# Patient Record
Sex: Male | Born: 1970 | Race: White | Hispanic: No | Marital: Married | Smoking: Never smoker
Health system: Southern US, Community
[De-identification: ages and names within clinical notes are randomized; demographics above are authoritative.]

## PROBLEM LIST (undated history)

## (undated) DIAGNOSIS — K760 Fatty (change of) liver, not elsewhere classified: Secondary | ICD-10-CM

## (undated) DIAGNOSIS — H9311 Tinnitus, right ear: Secondary | ICD-10-CM

## (undated) DIAGNOSIS — I10 Essential (primary) hypertension: Secondary | ICD-10-CM

## (undated) DIAGNOSIS — J342 Deviated nasal septum: Secondary | ICD-10-CM

## (undated) DIAGNOSIS — L723 Sebaceous cyst: Secondary | ICD-10-CM

## (undated) DIAGNOSIS — S60410A Abrasion of right index finger, initial encounter: Secondary | ICD-10-CM

## (undated) DIAGNOSIS — M66821 Spontaneous rupture of other tendons, right upper arm: Secondary | ICD-10-CM

## (undated) DIAGNOSIS — L03116 Cellulitis of left lower limb: Secondary | ICD-10-CM

## (undated) DIAGNOSIS — M79671 Pain in right foot: Secondary | ICD-10-CM

## (undated) DIAGNOSIS — Z98811 Dental restoration status: Secondary | ICD-10-CM

## (undated) DIAGNOSIS — M25511 Pain in right shoulder: Secondary | ICD-10-CM

## (undated) DIAGNOSIS — E669 Obesity, unspecified: Secondary | ICD-10-CM

## (undated) DIAGNOSIS — Z87442 Personal history of urinary calculi: Secondary | ICD-10-CM

## (undated) DIAGNOSIS — M1712 Unilateral primary osteoarthritis, left knee: Secondary | ICD-10-CM

## (undated) DIAGNOSIS — D682 Hereditary deficiency of other clotting factors: Secondary | ICD-10-CM

## (undated) DIAGNOSIS — M1990 Unspecified osteoarthritis, unspecified site: Secondary | ICD-10-CM

## (undated) DIAGNOSIS — J343 Hypertrophy of nasal turbinates: Secondary | ICD-10-CM

## (undated) DIAGNOSIS — M25562 Pain in left knee: Secondary | ICD-10-CM

## (undated) HISTORY — PX: INGUINAL HERNIA REPAIR: SHX194

## (undated) HISTORY — DX: Fatty (change of) liver, not elsewhere classified: K76.0

## (undated) HISTORY — DX: Tinnitus, right ear: H93.11

## (undated) HISTORY — DX: Essential (primary) hypertension: I10

## (undated) HISTORY — DX: Spontaneous rupture of other tendons, right upper arm: M66.821

## (undated) HISTORY — DX: Pain in left knee: M25.562

## (undated) HISTORY — PX: CYST EXCISION: SHX5701

## (undated) HISTORY — PX: WISDOM TOOTH EXTRACTION: SHX21

## (undated) HISTORY — PX: OTHER SURGICAL HISTORY: SHX169

## (undated) HISTORY — DX: Unilateral primary osteoarthritis, left knee: M17.12

## (undated) HISTORY — DX: Cellulitis of left lower limb: L03.116

## (undated) HISTORY — DX: Sebaceous cyst: L72.3

## (undated) HISTORY — DX: Obesity, unspecified: E66.9

## (undated) HISTORY — DX: Pain in right foot: M79.671

## (undated) HISTORY — DX: Pain in right shoulder: M25.511

---

## 2000-11-29 ENCOUNTER — Ambulatory Visit (HOSPITAL_COMMUNITY): Admission: RE | Admit: 2000-11-29 | Discharge: 2000-11-29 | Payer: Self-pay | Admitting: *Deleted

## 2000-11-29 ENCOUNTER — Encounter (INDEPENDENT_AMBULATORY_CARE_PROVIDER_SITE_OTHER): Payer: Self-pay | Admitting: Specialist

## 2001-09-22 ENCOUNTER — Ambulatory Visit (HOSPITAL_COMMUNITY): Admission: RE | Admit: 2001-09-22 | Discharge: 2001-09-22 | Payer: Self-pay | Admitting: Internal Medicine

## 2001-09-22 ENCOUNTER — Encounter: Payer: Self-pay | Admitting: Internal Medicine

## 2001-11-21 ENCOUNTER — Ambulatory Visit (HOSPITAL_COMMUNITY): Admission: RE | Admit: 2001-11-21 | Discharge: 2001-11-21 | Payer: Self-pay | Admitting: Internal Medicine

## 2001-11-21 ENCOUNTER — Encounter: Payer: Self-pay | Admitting: Internal Medicine

## 2003-03-05 ENCOUNTER — Ambulatory Visit (HOSPITAL_COMMUNITY): Admission: RE | Admit: 2003-03-05 | Discharge: 2003-03-05 | Payer: Self-pay | Admitting: Internal Medicine

## 2003-03-05 ENCOUNTER — Encounter: Payer: Self-pay | Admitting: Internal Medicine

## 2003-03-20 ENCOUNTER — Encounter (HOSPITAL_COMMUNITY): Admission: RE | Admit: 2003-03-20 | Discharge: 2003-04-19 | Payer: Self-pay | Admitting: Neurosurgery

## 2003-12-06 ENCOUNTER — Ambulatory Visit (HOSPITAL_COMMUNITY): Admission: RE | Admit: 2003-12-06 | Discharge: 2003-12-06 | Payer: Self-pay | Admitting: *Deleted

## 2003-12-06 ENCOUNTER — Encounter (INDEPENDENT_AMBULATORY_CARE_PROVIDER_SITE_OTHER): Payer: Self-pay | Admitting: Specialist

## 2005-12-22 ENCOUNTER — Ambulatory Visit (HOSPITAL_COMMUNITY): Admission: RE | Admit: 2005-12-22 | Discharge: 2005-12-22 | Payer: Self-pay | Admitting: Family Medicine

## 2006-03-21 ENCOUNTER — Encounter (HOSPITAL_COMMUNITY): Admission: RE | Admit: 2006-03-21 | Discharge: 2006-04-20 | Payer: Self-pay | Admitting: Family Medicine

## 2006-12-12 ENCOUNTER — Ambulatory Visit: Payer: Self-pay | Admitting: Orthopedic Surgery

## 2007-01-09 ENCOUNTER — Ambulatory Visit: Payer: Self-pay | Admitting: Orthopedic Surgery

## 2012-11-07 DIAGNOSIS — J343 Hypertrophy of nasal turbinates: Secondary | ICD-10-CM

## 2012-11-07 DIAGNOSIS — J342 Deviated nasal septum: Secondary | ICD-10-CM

## 2012-11-07 HISTORY — DX: Hypertrophy of nasal turbinates: J34.3

## 2012-11-07 HISTORY — DX: Deviated nasal septum: J34.2

## 2012-11-20 ENCOUNTER — Encounter (HOSPITAL_BASED_OUTPATIENT_CLINIC_OR_DEPARTMENT_OTHER): Payer: Self-pay | Admitting: *Deleted

## 2012-11-20 DIAGNOSIS — S60410A Abrasion of right index finger, initial encounter: Secondary | ICD-10-CM

## 2012-11-20 HISTORY — DX: Abrasion of right index finger, initial encounter: S60.410A

## 2012-11-20 NOTE — Pre-Procedure Instructions (Signed)
To come for BMET; EKG req. from [facility], [place] [phone])

## 2012-11-21 NOTE — Pre-Procedure Instructions (Signed)
Hx. discussed with Dr. Jean Rosenthal and Dr. Gypsy Balsam; pt. OK to come for surgery.

## 2012-11-23 ENCOUNTER — Encounter (HOSPITAL_BASED_OUTPATIENT_CLINIC_OR_DEPARTMENT_OTHER)
Admission: RE | Admit: 2012-11-23 | Discharge: 2012-11-23 | Disposition: A | Discharge status: Home / Self Care | Payer: 59 | Source: Ambulatory Visit | Attending: Otolaryngology | Admitting: Otolaryngology

## 2012-11-23 LAB — BASIC METABOLIC PANEL
CO2: 29 mEq/L (ref 19–32)
Calcium: 9.8 mg/dL (ref 8.4–10.5)
Creatinine, Ser: 0.97 mg/dL (ref 0.50–1.35)
GFR calc Af Amer: >90 mL/min (ref >90)

## 2012-11-27 ENCOUNTER — Encounter (HOSPITAL_BASED_OUTPATIENT_CLINIC_OR_DEPARTMENT_OTHER): Payer: Self-pay | Admitting: Anesthesiology

## 2012-11-27 ENCOUNTER — Ambulatory Visit (HOSPITAL_BASED_OUTPATIENT_CLINIC_OR_DEPARTMENT_OTHER)
Admission: RE | Admit: 2012-11-27 | Discharge: 2012-11-27 | Disposition: A | Discharge status: Home / Self Care | Payer: 59 | Source: Ambulatory Visit | Attending: Otolaryngology | Admitting: Otolaryngology

## 2012-11-27 ENCOUNTER — Encounter (HOSPITAL_BASED_OUTPATIENT_CLINIC_OR_DEPARTMENT_OTHER)
Admission: RE | Disposition: A | Discharge status: Home / Self Care | Payer: Self-pay | Source: Ambulatory Visit | Attending: Otolaryngology

## 2012-11-27 ENCOUNTER — Ambulatory Visit (HOSPITAL_BASED_OUTPATIENT_CLINIC_OR_DEPARTMENT_OTHER): Payer: 59 | Admitting: Anesthesiology

## 2012-11-27 DIAGNOSIS — J343 Hypertrophy of nasal turbinates: Secondary | ICD-10-CM | POA: Insufficient documentation

## 2012-11-27 DIAGNOSIS — I10 Essential (primary) hypertension: Secondary | ICD-10-CM | POA: Insufficient documentation

## 2012-11-27 DIAGNOSIS — J342 Deviated nasal septum: Secondary | ICD-10-CM | POA: Insufficient documentation

## 2012-11-27 DIAGNOSIS — H6980 Other specified disorders of Eustachian tube, unspecified ear: Secondary | ICD-10-CM | POA: Insufficient documentation

## 2012-11-27 DIAGNOSIS — Z9889 Other specified postprocedural states: Secondary | ICD-10-CM

## 2012-11-27 DIAGNOSIS — Z79899 Other long term (current) drug therapy: Secondary | ICD-10-CM | POA: Insufficient documentation

## 2012-11-27 DIAGNOSIS — H903 Sensorineural hearing loss, bilateral: Secondary | ICD-10-CM | POA: Insufficient documentation

## 2012-11-27 DIAGNOSIS — H6990 Unspecified Eustachian tube disorder, unspecified ear: Secondary | ICD-10-CM | POA: Insufficient documentation

## 2012-11-27 DIAGNOSIS — J3489 Other specified disorders of nose and nasal sinuses: Secondary | ICD-10-CM | POA: Insufficient documentation

## 2012-11-27 DIAGNOSIS — M549 Dorsalgia, unspecified: Secondary | ICD-10-CM | POA: Insufficient documentation

## 2012-11-27 HISTORY — DX: Abrasion of right index finger, initial encounter: S60.410A

## 2012-11-27 HISTORY — DX: Unspecified osteoarthritis, unspecified site: M19.90

## 2012-11-27 HISTORY — DX: Hypertrophy of nasal turbinates: J34.3

## 2012-11-27 HISTORY — DX: Essential (primary) hypertension: I10

## 2012-11-27 HISTORY — DX: Deviated nasal septum: J34.2

## 2012-11-27 HISTORY — PX: NASAL SEPTOPLASTY W/ TURBINOPLASTY: SHX2070

## 2012-11-27 HISTORY — DX: Hereditary deficiency of other clotting factors: D68.2

## 2012-11-27 HISTORY — DX: Dental restoration status: Z98.811

## 2012-11-27 LAB — POCT HEMOGLOBIN-HEMACUE: Hemoglobin: 15.7 g/dL (ref 13.0–17.0)

## 2012-11-27 SURGERY — SEPTOPLASTY, NOSE, WITH NASAL TURBINATE REDUCTION
Anesthesia: General | Site: Nose | Laterality: Bilateral | Wound class: Clean Contaminated

## 2012-11-27 MED ORDER — LIDOCAINE-EPINEPHRINE 1 %-1:100000 IJ SOLN
INTRAMUSCULAR | Status: DC | PRN
  Administered 2012-11-27: 6 mL

## 2012-11-27 MED ORDER — ONDANSETRON HCL 4 MG/2ML IJ SOLN
INTRAMUSCULAR | Status: DC | PRN
  Administered 2012-11-27: 4 mg via INTRAVENOUS

## 2012-11-27 MED ORDER — FENTANYL CITRATE 0.05 MG/ML IJ SOLN
INTRAMUSCULAR | Status: DC | PRN
  Administered 2012-11-27 (×3): 50 ug via INTRAVENOUS
  Administered 2012-11-27: 100 ug via INTRAVENOUS

## 2012-11-27 MED ORDER — HYDROMORPHONE HCL PF 1 MG/ML IJ SOLN: 0.2500 mg | INTRAMUSCULAR | Status: DC | PRN

## 2012-11-27 MED ORDER — OXYMETAZOLINE HCL 0.05 % NA SOLN
NASAL | Status: DC | PRN
  Administered 2012-11-27: 1 via NASAL

## 2012-11-27 MED ORDER — AMOXICILLIN 875 MG PO TABS: 875.0000 mg | ORAL_TABLET | Freq: Two times a day (BID) | ORAL | Status: AC

## 2012-11-27 MED ORDER — MIDAZOLAM HCL 2 MG/ML PO SYRP: 12.0000 mg | ORAL_SOLUTION | Freq: Once | ORAL | Status: DC | PRN

## 2012-11-27 MED ORDER — PROPOFOL 10 MG/ML IV BOLUS
INTRAVENOUS | Status: DC | PRN
  Administered 2012-11-27: 250 mg via INTRAVENOUS

## 2012-11-27 MED ORDER — OXYCODONE HCL 5 MG PO TABS
5.0000 mg | ORAL_TABLET | Freq: Once | ORAL | Status: AC | PRN
  Administered 2012-11-27: 5 mg via ORAL

## 2012-11-27 MED ORDER — METOCLOPRAMIDE HCL 5 MG/ML IJ SOLN: 10.0000 mg | Freq: Once | INTRAMUSCULAR | Status: DC | PRN

## 2012-11-27 MED ORDER — OXYCODONE-ACETAMINOPHEN 5-325 MG PO TABS: 1.0000 | ORAL_TABLET | Freq: Four times a day (QID) | ORAL | Status: DC | PRN

## 2012-11-27 MED ORDER — OXYCODONE HCL 5 MG/5ML PO SOLN: 5.0000 mg | Freq: Once | ORAL | Status: AC | PRN

## 2012-11-27 MED ORDER — LACTATED RINGERS IV SOLN
INTRAVENOUS | Status: DC
  Administered 2012-11-27 (×2): via INTRAVENOUS

## 2012-11-27 MED ORDER — FENTANYL CITRATE 0.05 MG/ML IJ SOLN: 50.0000 ug | INTRAMUSCULAR | Status: DC | PRN

## 2012-11-27 MED ORDER — LIDOCAINE HCL (CARDIAC) 20 MG/ML IV SOLN
INTRAVENOUS | Status: DC | PRN
  Administered 2012-11-27: 100 mg via INTRAVENOUS

## 2012-11-27 MED ORDER — MUPIROCIN 2 % EX OINT
TOPICAL_OINTMENT | CUTANEOUS | Status: DC | PRN
  Administered 2012-11-27: 1 via NASAL

## 2012-11-27 MED ORDER — DEXAMETHASONE SODIUM PHOSPHATE 4 MG/ML IJ SOLN
INTRAMUSCULAR | Status: DC | PRN
  Administered 2012-11-27: 8 mg via INTRAVENOUS

## 2012-11-27 MED ORDER — MIDAZOLAM HCL 2 MG/2ML IJ SOLN: 1.0000 mg | INTRAMUSCULAR | Status: DC | PRN

## 2012-11-27 MED ORDER — SUCCINYLCHOLINE CHLORIDE 20 MG/ML IJ SOLN
INTRAMUSCULAR | Status: DC | PRN
  Administered 2012-11-27: 100 mg via INTRAVENOUS

## 2012-11-27 MED ORDER — MIDAZOLAM HCL 5 MG/5ML IJ SOLN
INTRAMUSCULAR | Status: DC | PRN
  Administered 2012-11-27: 2 mg via INTRAVENOUS

## 2012-11-27 SURGICAL SUPPLY — 33 items
ATTRACTOMAT 16X20 MAGNETIC DRP (DRAPES) IMPLANT
BLADE SURG 15 STRL LF DISP TIS (BLADE) IMPLANT
BLADE SURG 15 STRL SS (BLADE)
CANISTER SUCTION 1200CC (MISCELLANEOUS) ×2 IMPLANT
CLOTH BEACON ORANGE TIMEOUT ST (SAFETY) ×2 IMPLANT
COAGULATOR SUCT 8FR VV (MISCELLANEOUS) ×2 IMPLANT
DECANTER SPIKE VIAL GLASS SM (MISCELLANEOUS) IMPLANT
DRSG NASOPORE 8CM (GAUZE/BANDAGES/DRESSINGS) IMPLANT
DRSG TELFA 3X8 NADH (GAUZE/BANDAGES/DRESSINGS) IMPLANT
ELECT REM PT RETURN 9FT ADLT (ELECTROSURGICAL) ×2
ELECTRODE REM PT RTRN 9FT ADLT (ELECTROSURGICAL) ×1 IMPLANT
GLOVE BIO SURGEON STRL SZ7 (GLOVE) ×2 IMPLANT
GLOVE BIO SURGEON STRL SZ7.5 (GLOVE) ×2 IMPLANT
GOWN PREVENTION PLUS XLARGE (GOWN DISPOSABLE) ×2 IMPLANT
GOWN PREVENTION PLUS XXLARGE (GOWN DISPOSABLE) ×2 IMPLANT
NEEDLE HYPO 25X1 1.5 SAFETY (NEEDLE) ×2 IMPLANT
NS IRRIG 1000ML POUR BTL (IV SOLUTION) ×2 IMPLANT
PACK BASIN DAY SURGERY FS (CUSTOM PROCEDURE TRAY) ×2 IMPLANT
PACK ENT DAY SURGERY (CUSTOM PROCEDURE TRAY) ×2 IMPLANT
SLEEVE SCD COMPRESS KNEE MED (MISCELLANEOUS) IMPLANT
SOLUTION BUTLER CLEAR DIP (MISCELLANEOUS) ×2 IMPLANT
SPLINT NASAL DOYLE BI-VL (GAUZE/BANDAGES/DRESSINGS) ×2 IMPLANT
SPONGE GAUZE 2X2 8PLY STRL LF (GAUZE/BANDAGES/DRESSINGS) ×2 IMPLANT
SPONGE NEURO XRAY DETECT 1X3 (DISPOSABLE) ×2 IMPLANT
SUT CHROMIC 4 0 P 3 18 (SUTURE) ×2 IMPLANT
SUT PLAIN 4 0 ~~LOC~~ 1 (SUTURE) ×2 IMPLANT
SUT PROLENE 3 0 PS 2 (SUTURE) ×2 IMPLANT
SUT VIC AB 4-0 P-3 18XBRD (SUTURE) IMPLANT
SUT VIC AB 4-0 P3 18 (SUTURE)
TOWEL OR 17X24 6PK STRL BLUE (TOWEL DISPOSABLE) ×2 IMPLANT
TUBE SALEM SUMP 12R W/ARV (TUBING) IMPLANT
TUBE SALEM SUMP 16 FR W/ARV (TUBING) ×2 IMPLANT
YANKAUER SUCT BULB TIP NO VENT (SUCTIONS) ×2 IMPLANT

## 2012-11-27 NOTE — Anesthesia Procedure Notes (Signed)
Procedure Name: Intubation Date/Time: 11/27/2012 7:38 AM Performed by: Caren Macadam Pre-anesthesia Checklist: Patient identified, Emergency Drugs available, Suction available and Patient being monitored Patient Re-evaluated:Patient Re-evaluated prior to inductionOxygen Delivery Method: Circle System Utilized Preoxygenation: Pre-oxygenation with 100% oxygen Intubation Type: IV induction Ventilation: Mask ventilation without difficulty Laryngoscope Size: Miller and 2 Grade View: Grade I Tube type: Oral Number of attempts: 1 Airway Equipment and Method: stylet and oral airway Placement Confirmation: ETT inserted through vocal cords under direct vision,  positive ETCO2 and breath sounds checked- equal and bilateral Secured at: 22 cm Tube secured with: Tape Dental Injury: Teeth and Oropharynx as per pre-operative assessment

## 2012-11-27 NOTE — Anesthesia Postprocedure Evaluation (Signed)
Anesthesia Post Note  Patient: Christopher Lawson  Procedure(s) Performed: Procedure(s) (LRB): NASAL SEPTOPLASTY WITH BILATERAL TURBINATE RESECTION (Bilateral)  Anesthesia type: General  Patient location: PACU  Post pain: Pain level controlled  Post assessment: Patient's Cardiovascular Status Stable  Last Vitals:  Filed Vitals:   11/27/12 1015  BP: 143/93  Pulse: 67  Temp:   Resp: 14    Post vital signs: Reviewed and stable  Level of consciousness: alert  Complications: No apparent anesthesia complications

## 2012-11-27 NOTE — Op Note (Signed)
DATE OF PROCEDURE: 11/27/2012  OPERATIVE REPORT   SURGEON: Newman Pies, MD   PREOPERATIVE DIAGNOSES:  1. Severe nasal septal deviation.  2. Bilateral inferior turbinate hypertrophy.  3. Chronic nasal obstruction.  POSTOPERATIVE DIAGNOSES:  1. Severe nasal septal deviation.  2. Bilateral inferior turbinate hypertrophy.  3. Chronic nasal obstruction.  PROCEDURE PERFORMED:  1. Septoplasty.  2. Bilateral partial inferior turbinate resection.   ANESTHESIA: General endotracheal tube anesthesia.   COMPLICATIONS: None.   ESTIMATED BLOOD LOSS: Less than xxx mL.   INDICATION FOR PROCEDURE: Christopher Lawson is a 42 y.o. male with a history of chronic nasal obstruction. The patient was  treated with antihistamine, decongestant, steroid nasal spray, and systemic steroids. However, the patient continues to be symptomatic. On examination, the patient was noted to have bilateral severe inferior turbinate hypertrophy and significant nasal septal deviation, causing significant nasal obstruction. More than 90% of his nasal airway is obstructed bilaterally. Based on the above findings, the decision was made for the patient to undergo the above-stated procedures. The risks, benefits, alternatives, and details of the procedure were discussed with the patient. Questions were invited and answered. Informed consent was obtained.   DESCRIPTION OF PROCEDURE: The patient was taken to the operating room and placed supine on the operating table. General endotracheal tube anesthesia was administered by the anesthesiologist. The patient was positioned, and prepped and draped in the standard fashion for nasal surgery. Pledgets soaked with Afrin were placed in both nasal cavities for decongestion. The pledgets were subsequently removed. The above mentioned severe septal deviation was again noted. 1% lidocaine with 1:100,000 epinephrine was injected onto the nasal septum bilaterally. A hemitransfixion incision was made on the  left side. The mucosal flap was carefully elevated on the left side. A cartilaginous incision was made 1 cm superior to the caudal margin of the nasal septum. Mucosal flap was also elevated on the right side in the similar fashion. It should be noted that due to the severe septal deviation, the deviated portion of the cartilaginous and bony septum had to be removed in piecemeal fashion. Once the deviated portions were removed, a straight midline septum was achieved. The septum was then quilted with 4-0 plain gut sutures. The hemitransfixion incision was closed with interrupted 4-0 chromic sutures. Doyle splints were applied.   Prior to the [facility] splint application, the inferior one half of both hypertrophied inferior turbinate was crossclamped with a Kelly clamp. The inferior one half of each inferior turbinate was then resected with a pair of cross cutting scissors. Hemostasis was achieved with a suction cautery device.   The care of the patient was turned over to the anesthesiologist. The patient was awakened from anesthesia without difficulty. The patient was extubated and transferred to the recovery room in good condition.   OPERATIVE FINDINGS: Severe nasal septal deviation and bilateral inferior turbinate hypertrophy.   SPECIMEN: None.   FOLLOWUP CARE: The patient be discharged home once he is awake and alert. The patient will be placed on Percocet 1-2 tablets p.o. q.6 hours p.r.n. pain, and amoxicillin 875 mg p.o. b.i.d. for 5 days. The patient will follow up in my office in approximately 1 week for splint removal.   Yuriana Gaal Philomena Doheny, MD

## 2012-11-27 NOTE — Anesthesia Preprocedure Evaluation (Signed)
Anesthesia Evaluation  Patient identified by MRN, date of birth, ID band Patient awake    Reviewed: Allergy & Precautions, H&P , NPO status , Patient's Chart, lab work & pertinent test results, reviewed documented beta blocker date and time   Airway Mallampati: II TM Distance: >3 FB Neck ROM: full    Dental   Pulmonary neg pulmonary ROS,  breath sounds clear to auscultation        Cardiovascular hypertension, On Medications Rhythm:regular     Neuro/Psych negative neurological ROS  negative psych ROS   GI/Hepatic negative GI ROS, Neg liver ROS,   Endo/Other  negative endocrine ROS  Renal/GU negative Renal ROS  negative genitourinary   Musculoskeletal   Abdominal   Peds  Hematology  (+) Blood dyscrasia, ,   Anesthesia Other Findings See surgeon's H&P   Reproductive/Obstetrics negative OB ROS                           Anesthesia Physical Anesthesia Plan  ASA: II  Anesthesia Plan: General   Post-op Pain Management:    Induction: Intravenous  Airway Management Planned: Oral ETT  Additional Equipment:   Intra-op Plan:   Post-operative Plan: Extubation in OR  Informed Consent: I have reviewed the patients History and Physical, chart, labs and discussed the procedure including the risks, benefits and alternatives for the proposed anesthesia with the patient or authorized representative who has indicated his/her understanding and acceptance.   Dental Advisory Given  Plan Discussed with: CRNA and Surgeon  Anesthesia Plan Comments:         Anesthesia Quick Evaluation

## 2012-11-27 NOTE — Transfer of Care (Signed)
Immediate Anesthesia Transfer of Care Note  Patient: Christopher Lawson  Procedure(s) Performed: Procedure(s): NASAL SEPTOPLASTY WITH BILATERAL TURBINATE RESECTION (Bilateral)  Patient Location: PACU  Anesthesia Type:General  Level of Consciousness: awake  Airway & Oxygen Therapy: Patient Spontanous Breathing and Patient connected to face mask oxygen  Post-op Assessment: Report given to PACU RN and Post -op Vital signs reviewed and stable  Post vital signs: Reviewed and stable  Complications: No apparent anesthesia complications

## 2012-11-27 NOTE — H&P (Signed)
  H&P Update  Pt's original H&P dated 10/30/12 reviewed and placed in chart (to be scanned).  I personally examined the patient today.  No change in health. Proceed with septoplasty and bilateral turbinate reduction.

## 2012-11-27 NOTE — Brief Op Note (Signed)
11/27/2012  9:12 AM  PATIENT:  Christopher Lawson  42 y.o. male  PRE-OPERATIVE DIAGNOSIS:  DEVIATED SEPTUM AND BILATERAL TURBINATE HYPERTROPHY  POST-OPERATIVE DIAGNOSIS:  DEVIATED SEPTUM AND BILATERAL TURBINATE HYPERTROPHY  PROCEDURE:  Procedure(s): 1) NASAL SEPTOPLASTY  2) BILATERAL PARTIAL INFERIOR TURBINATE RESECTION  SURGEON:  Surgeon(s) and Role:    * Darletta Moll, MD - Primary  PHYSICIAN ASSISTANT:   ASSISTANTS: none   ANESTHESIA:   general  EBL:  Total I/O In: 1200 [I.V.:1200] Out: -  BLOOD ADMINISTERED:none  DRAINS: none   LOCAL MEDICATIONS USED:  LIDOCAINE   SPECIMEN:  No Specimen  DISPOSITION OF SPECIMEN:  N/A  COUNTS:  YES  TOURNIQUET:  * No tourniquets in log *  DICTATION: .Note written in EPIC  PLAN OF CARE: Discharge to home after PACU  PATIENT DISPOSITION:  PACU - hemodynamically stable.   Delay start of Pharmacological VTE agent (>24hrs) due to surgical blood loss or risk of bleeding: not applicable

## 2012-11-28 ENCOUNTER — Encounter (HOSPITAL_BASED_OUTPATIENT_CLINIC_OR_DEPARTMENT_OTHER): Payer: Self-pay | Admitting: Otolaryngology

## 2013-01-30 ENCOUNTER — Ambulatory Visit (HOSPITAL_COMMUNITY)
Admission: RE | Admit: 2013-01-30 | Discharge: 2013-01-30 | Disposition: A | Discharge status: Home / Self Care | Payer: 59 | Source: Ambulatory Visit | Attending: Internal Medicine | Admitting: Internal Medicine

## 2013-01-30 ENCOUNTER — Other Ambulatory Visit (HOSPITAL_COMMUNITY): Payer: Self-pay | Admitting: Internal Medicine

## 2013-01-30 DIAGNOSIS — R05 Cough: Secondary | ICD-10-CM

## 2013-01-30 DIAGNOSIS — R059 Cough, unspecified: Secondary | ICD-10-CM

## 2014-04-29 ENCOUNTER — Ambulatory Visit (INDEPENDENT_AMBULATORY_CARE_PROVIDER_SITE_OTHER): Payer: 59 | Admitting: Orthopedic Surgery

## 2014-04-29 DIAGNOSIS — M25521 Pain in right elbow: Secondary | ICD-10-CM

## 2014-04-29 DIAGNOSIS — M25529 Pain in unspecified elbow: Secondary | ICD-10-CM

## 2014-04-30 DIAGNOSIS — M25529 Pain in unspecified elbow: Secondary | ICD-10-CM | POA: Insufficient documentation
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
## 2014-08-19 ENCOUNTER — Ambulatory Visit (INDEPENDENT_AMBULATORY_CARE_PROVIDER_SITE_OTHER): Payer: 59 | Admitting: Orthopedic Surgery

## 2014-08-19 ENCOUNTER — Encounter: Payer: Self-pay | Admitting: Orthopedic Surgery

## 2014-08-19 VITALS — BP 132/98 | Ht 72.0 in | Wt 229.0 lb

## 2014-08-19 DIAGNOSIS — S46111A Strain of muscle, fascia and tendon of long head of biceps, right arm, initial encounter: Secondary | ICD-10-CM

## 2014-08-19 DIAGNOSIS — S46219A Strain of muscle, fascia and tendon of other parts of biceps, unspecified arm, initial encounter: Secondary | ICD-10-CM | POA: Insufficient documentation

## 2014-08-19 MED ORDER — NAPROXEN 500 MG PO TABS: 500.0000 mg | ORAL_TABLET | Freq: Two times a day (BID) | ORAL | Status: DC

## 2014-08-19 NOTE — Patient Instructions (Signed)
We will schedule MRI for you and call you with results 
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
## 2014-08-23 ENCOUNTER — Encounter: Payer: Self-pay | Admitting: Orthopedic Surgery

## 2014-08-23 ENCOUNTER — Encounter (HOSPITAL_COMMUNITY): Payer: Self-pay

## 2014-08-23 ENCOUNTER — Other Ambulatory Visit (HOSPITAL_COMMUNITY): Payer: 59

## 2014-08-23 ENCOUNTER — Ambulatory Visit (HOSPITAL_COMMUNITY)
Admission: RE | Admit: 2014-08-23 | Discharge: 2014-08-23 | Disposition: A | Discharge status: Home / Self Care | Payer: 59 | Source: Ambulatory Visit | Attending: Orthopedic Surgery | Admitting: Orthopedic Surgery

## 2014-08-23 DIAGNOSIS — M25521 Pain in right elbow: Secondary | ICD-10-CM | POA: Diagnosis not present

## 2014-08-23 DIAGNOSIS — S46111A Strain of muscle, fascia and tendon of long head of biceps, right arm, initial encounter: Secondary | ICD-10-CM

## 2014-08-28 ENCOUNTER — Inpatient Hospital Stay (HOSPITAL_COMMUNITY): Admission: RE | Admit: 2014-08-28 | Payer: 59 | Source: Ambulatory Visit

## 2014-08-29 ENCOUNTER — Telehealth: Payer: Self-pay | Admitting: Orthopedic Surgery

## 2014-08-29 ENCOUNTER — Encounter: Payer: Self-pay | Admitting: Orthopedic Surgery

## 2014-08-29 ENCOUNTER — Ambulatory Visit (INDEPENDENT_AMBULATORY_CARE_PROVIDER_SITE_OTHER): Payer: 59 | Admitting: Orthopedic Surgery

## 2014-08-29 VITALS — BP 150/91 | Ht 72.0 in | Wt 230.0 lb

## 2014-08-29 DIAGNOSIS — M7521 Bicipital tendinitis, right shoulder: Secondary | ICD-10-CM

## 2014-08-29 DIAGNOSIS — S46111D Strain of muscle, fascia and tendon of long head of biceps, right arm, subsequent encounter: Secondary | ICD-10-CM

## 2014-08-29 MED ORDER — INDOMETHACIN 50 MG PO CAPS: 50.0000 mg | ORAL_CAPSULE | Freq: Two times a day (BID) | ORAL | Status: DC
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
## 2014-08-29 NOTE — Patient Instructions (Signed)
REST   OOW 4 WEEKS   START NEW MEDICATION

## 2014-08-29 NOTE — Telephone Encounter (Signed)
Notes/form faxed to [employer] [facility] regarding patient's short-term disability claim, to Fax# [phone]/Ph# [phone]. Patient aware.

## 2014-09-26 ENCOUNTER — Encounter: Payer: Self-pay | Admitting: Orthopedic Surgery

## 2014-09-26 ENCOUNTER — Ambulatory Visit (INDEPENDENT_AMBULATORY_CARE_PROVIDER_SITE_OTHER): Payer: 59 | Admitting: Orthopedic Surgery

## 2014-09-26 ENCOUNTER — Telehealth: Payer: Self-pay | Admitting: Orthopedic Surgery

## 2014-09-26 VITALS — BP 140/97 | Ht 72.0 in | Wt 230.0 lb

## 2014-09-26 DIAGNOSIS — M7521 Bicipital tendinitis, right shoulder: Secondary | ICD-10-CM

## 2014-09-26 NOTE — Patient Instructions (Addendum)
Will refer to Dr Butler DenmarkGrammig  Out of work 4 weeks
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
## 2014-09-26 NOTE — Telephone Encounter (Signed)
    Expand All Collapse All   Notes/form faxed to [facility] regarding patient's short-term disability claim, to Fax# [phone]/Ph# [phone]. Patient aware.

## 2014-09-30 ENCOUNTER — Telehealth: Payer: Self-pay | Admitting: *Deleted

## 2014-09-30 ENCOUNTER — Other Ambulatory Visit: Payer: Self-pay | Admitting: *Deleted

## 2014-09-30 DIAGNOSIS — M7521 Bicipital tendinitis, right shoulder: Secondary | ICD-10-CM

## 2014-09-30 NOTE — Telephone Encounter (Signed)
REFERRAL FAXED TO DR Amanda PeaGRAMIG 09/30/14

## 2014-10-04 NOTE — Telephone Encounter (Signed)
Has appointment 10/11/14 10:15am w/ Dr Amanda PeaGramig

## 2016-08-11 DIAGNOSIS — M67921 Unspecified disorder of synovium and tendon, right upper arm: Secondary | ICD-10-CM | POA: Diagnosis not present

## 2016-08-18 DIAGNOSIS — M67921 Unspecified disorder of synovium and tendon, right upper arm: Secondary | ICD-10-CM | POA: Diagnosis not present

## 2016-08-19 DIAGNOSIS — L602 Onychogryphosis: Secondary | ICD-10-CM | POA: Diagnosis not present

## 2016-08-19 DIAGNOSIS — L03031 Cellulitis of right toe: Secondary | ICD-10-CM | POA: Diagnosis not present

## 2016-08-20 DIAGNOSIS — M67921 Unspecified disorder of synovium and tendon, right upper arm: Secondary | ICD-10-CM | POA: Diagnosis not present

## 2016-08-20 DIAGNOSIS — Z4789 Encounter for other orthopedic aftercare: Secondary | ICD-10-CM | POA: Diagnosis not present

## 2016-08-23 DIAGNOSIS — M67921 Unspecified disorder of synovium and tendon, right upper arm: Secondary | ICD-10-CM | POA: Diagnosis not present

## 2016-09-01 DIAGNOSIS — M67921 Unspecified disorder of synovium and tendon, right upper arm: Secondary | ICD-10-CM | POA: Diagnosis not present

## 2016-09-10 DIAGNOSIS — Z4789 Encounter for other orthopedic aftercare: Secondary | ICD-10-CM | POA: Diagnosis not present

## 2016-09-10 DIAGNOSIS — M67921 Unspecified disorder of synovium and tendon, right upper arm: Secondary | ICD-10-CM | POA: Diagnosis not present

## 2016-09-17 DIAGNOSIS — M67921 Unspecified disorder of synovium and tendon, right upper arm: Secondary | ICD-10-CM | POA: Diagnosis not present

## 2016-09-20 DIAGNOSIS — M67921 Unspecified disorder of synovium and tendon, right upper arm: Secondary | ICD-10-CM | POA: Diagnosis not present

## 2016-09-27 DIAGNOSIS — M67921 Unspecified disorder of synovium and tendon, right upper arm: Secondary | ICD-10-CM | POA: Diagnosis not present

## 2016-10-04 DIAGNOSIS — M67921 Unspecified disorder of synovium and tendon, right upper arm: Secondary | ICD-10-CM | POA: Diagnosis not present

## 2016-10-08 DIAGNOSIS — M67921 Unspecified disorder of synovium and tendon, right upper arm: Secondary | ICD-10-CM | POA: Diagnosis not present

## 2016-10-12 DIAGNOSIS — M67921 Unspecified disorder of synovium and tendon, right upper arm: Secondary | ICD-10-CM | POA: Diagnosis not present

## 2016-10-19 DIAGNOSIS — M67921 Unspecified disorder of synovium and tendon, right upper arm: Secondary | ICD-10-CM | POA: Diagnosis not present

## 2016-11-01 DIAGNOSIS — M67921 Unspecified disorder of synovium and tendon, right upper arm: Secondary | ICD-10-CM | POA: Diagnosis not present

## 2016-12-08 DIAGNOSIS — M67921 Unspecified disorder of synovium and tendon, right upper arm: Secondary | ICD-10-CM | POA: Diagnosis not present

## 2016-12-08 DIAGNOSIS — Z4789 Encounter for other orthopedic aftercare: Secondary | ICD-10-CM | POA: Diagnosis not present

## 2017-02-24 DIAGNOSIS — L602 Onychogryphosis: Secondary | ICD-10-CM | POA: Diagnosis not present

## 2017-02-24 DIAGNOSIS — L03031 Cellulitis of right toe: Secondary | ICD-10-CM | POA: Diagnosis not present

## 2017-03-03 DIAGNOSIS — I10 Essential (primary) hypertension: Secondary | ICD-10-CM | POA: Diagnosis not present

## 2017-03-03 DIAGNOSIS — M542 Cervicalgia: Secondary | ICD-10-CM | POA: Diagnosis not present

## 2017-08-24 DIAGNOSIS — L602 Onychogryphosis: Secondary | ICD-10-CM | POA: Diagnosis not present

## 2017-08-24 DIAGNOSIS — L03031 Cellulitis of right toe: Secondary | ICD-10-CM | POA: Diagnosis not present

## 2017-10-05 ENCOUNTER — Ambulatory Visit (HOSPITAL_COMMUNITY)
Admission: RE | Admit: 2017-10-05 | Discharge: 2017-10-05 | Disposition: A | Discharge status: Home / Self Care | Payer: 59 | Source: Ambulatory Visit | Attending: Pulmonary Disease | Admitting: Pulmonary Disease

## 2017-10-05 ENCOUNTER — Other Ambulatory Visit (HOSPITAL_COMMUNITY): Payer: Self-pay | Admitting: Pulmonary Disease

## 2017-10-05 DIAGNOSIS — M25562 Pain in left knee: Secondary | ICD-10-CM

## 2017-10-05 DIAGNOSIS — M1712 Unilateral primary osteoarthritis, left knee: Secondary | ICD-10-CM | POA: Insufficient documentation

## 2017-10-05 DIAGNOSIS — I10 Essential (primary) hypertension: Secondary | ICD-10-CM | POA: Diagnosis not present

## 2017-12-07 ENCOUNTER — Other Ambulatory Visit: Payer: Self-pay

## 2017-12-07 ENCOUNTER — Emergency Department (HOSPITAL_COMMUNITY)
Admission: EM | Admit: 2017-12-07 | Discharge: 2017-12-07 | Disposition: A | Discharge status: Home / Self Care | Payer: 59 | Attending: Emergency Medicine | Admitting: Emergency Medicine

## 2017-12-07 ENCOUNTER — Encounter (HOSPITAL_COMMUNITY): Payer: Self-pay | Admitting: Emergency Medicine

## 2017-12-07 DIAGNOSIS — Y9389 Activity, other specified: Secondary | ICD-10-CM | POA: Diagnosis not present

## 2017-12-07 DIAGNOSIS — Y929 Unspecified place or not applicable: Secondary | ICD-10-CM | POA: Insufficient documentation

## 2017-12-07 DIAGNOSIS — I10 Essential (primary) hypertension: Secondary | ICD-10-CM | POA: Diagnosis not present

## 2017-12-07 DIAGNOSIS — Y999 Unspecified external cause status: Secondary | ICD-10-CM | POA: Diagnosis not present

## 2017-12-07 DIAGNOSIS — Z79899 Other long term (current) drug therapy: Secondary | ICD-10-CM | POA: Diagnosis not present

## 2017-12-07 DIAGNOSIS — S61215A Laceration without foreign body of left ring finger without damage to nail, initial encounter: Secondary | ICD-10-CM

## 2017-12-07 DIAGNOSIS — W298XXA Contact with other powered powered hand tools and household machinery, initial encounter: Secondary | ICD-10-CM | POA: Diagnosis not present

## 2017-12-07 MED ORDER — TETANUS-DIPHTH-ACELL PERTUSSIS 5-2.5-18.5 LF-MCG/0.5 IM SUSP
0.5000 mL | Freq: Once | INTRAMUSCULAR | Status: AC
  Administered 2017-12-07: 0.5 mL via INTRAMUSCULAR
  Filled 2017-12-07: qty 0.5

## 2017-12-07 MED ORDER — CEPHALEXIN 500 MG PO CAPS: 500.0000 mg | ORAL_CAPSULE | Freq: Four times a day (QID) | ORAL | 0 refills | Status: DC

## 2017-12-07 MED ORDER — CEPHALEXIN 500 MG PO CAPS
500.0000 mg | ORAL_CAPSULE | Freq: Once | ORAL | Status: AC
  Administered 2017-12-07: 500 mg via ORAL
  Filled 2017-12-07: qty 1

## 2017-12-07 NOTE — ED Triage Notes (Signed)
Pt pressure washing and water hit left ring finger. Mild laceration noted to anterior left ring finger with bleeding controlled. uknknown TET. Nad.

## 2017-12-07 NOTE — ED Provider Notes (Signed)
[facility] EMERGENCY DEPARTMENT Provider Note   CSN: [id number] Arrival date & time: 12/07/17  1426     History   Chief Complaint Chief Complaint  Patient presents with  . Laceration    HPI Christopher Lawson is a 47 y.o. male.  HPI   Christopher Lawson is a 47 y.o. male who presents to the Emergency Department complaining of laceration to the left ring finger.  He states that he was using a pressure washer when the nozzle slipped in his hand causing the laceration secondary to the  pressure from the water.  He is concerned about infection stating that his hand was dirty.  Last tetanus is unknown.  He denies pain, numbness or swelling or the finger.      Past Medical History:  Diagnosis Date  . Abrasion of right index finger 11/20/2012  . Arthritis    left shoulder - to have surgery 12/12/2012  . Dental crown present   . Deviated nasal septum 11/2012  . 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 Hypertension    under control with meds., has been on med. x 5 yr.  . 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    Patient Active Problem List   Diagnosis Date Noted  . Biceps tendon tear 08/19/2014  . Elbow pain 04/30/2014    Past Surgical History:  Procedure Laterality Date  . CYST EXCISION     wrist and back (same surgery)  . INGUINAL HERNIA REPAIR Right   . NASAL SEPTOPLASTY W/ TURBINOPLASTY Bilateral 11/27/2012   Procedure: NASAL SEPTOPLASTY WITH BILATERAL TURBINATE RESECTION;  Surgeon: Darletta Moll, MD;  Location: [facility];  Service: ENT;  Laterality: Bilateral;  . WISDOM TOOTH EXTRACTION          Home Medications    Prior to Admission medications   Medication Sig Start Date End Date Taking? Authorizing Provider  amLODipine-benazepril (LOTREL) 10-20 MG per capsule Take 1 capsule by mouth daily.    [provider]  enalapril (VASOTEC) 10 MG tablet Take 20 mg by mouth daily.     [provider]  indomethacin  (INDOCIN) 50 MG capsule Take 1 capsule (50 mg total) by mouth 2 (two) times daily with a meal. 08/29/14   Vickki Hearing, MD  naproxen (NAPROSYN) 500 MG tablet Take 1 tablet (500 mg total) by mouth 2 (two) times daily with a meal. Patient not taking: Reported on 09/26/2014 08/19/14   Vickki Hearing, MD    Family History History reviewed. No pertinent family history.  Social History Social History   Tobacco Use  . Smoking status: Never Smoker  . Smokeless tobacco: Never Used  Substance Use Topics  . Alcohol use: Yes    Comment: occasionally  . Drug use: No     Allergies   Patient has no known allergies.   Review of Systems Review of Systems  Constitutional: Negative for chills and fever.  Musculoskeletal: Negative for arthralgias, back pain and joint swelling.  Skin: Positive for wound.       Laceration left ring finger  Neurological: Negative for dizziness, weakness and numbness.  Hematological: Does not bruise/bleed easily.  All other systems reviewed and are negative.    Physical Exam Updated Vital Signs BP 130/87 (BP Location: Left Arm)   Pulse 76   Temp 98.2 F (36.8 C) (Oral)   Resp 18   Ht  (1.753 m)   Wt  102.1 kg (225 lb)   SpO2 98%   BMI 33.23 kg/m   Physical Exam  Constitutional: He is oriented to person, place, and time. He appears well-developed and well-nourished. No distress.  HENT:  Head: Atraumatic.  Cardiovascular: Normal rate, regular rhythm and intact distal pulses.  No murmur heard. Pulmonary/Chest: Effort normal and breath sounds normal. No respiratory distress.  Musculoskeletal: Normal range of motion. He exhibits no edema.  Pt has full ROM of the finger.    Neurological: He is alert and oriented to person, place, and time. No sensory deficit.  Skin: Skin is warm. Capillary refill takes less than 2 seconds.  superficial 1.5 cm laceration to the volar surface of the proximal left ring finger.  No FB's, edema or active  bleeding.    Nursing note and vitals reviewed.    ED Treatments / Results  Labs (all labs ordered are listed, but only abnormal results are displayed) Labs Reviewed - No data to display  EKG None  Radiology No results found.  Procedures Procedures (including critical care time)  Medications Ordered in ED Medications  Tdap (BOOSTRIX) injection 0.5 mL (has no administration in time range)  cephALEXin (KEFLEX) capsule 500 mg (has no administration in time range)     Initial Impression / Assessment and Plan / ED Course  I have reviewed the triage vital signs and the nursing notes.  Pertinent labs & imaging results that were available during my care of the patient were reviewed by me and considered in my medical decision making (see chart for details).     Wound irrigated and cleaned by me.  Laceration is superficial and repair is not needed.   Td updated, finger bandaged.  Pt agrees to wound care instructions and return precautions discussed.   Final Clinical Impressions(s) / ED Diagnoses   Final diagnoses:  Laceration of left ring finger without foreign body without damage to nail, initial encounter    ED Discharge Orders    None       Pauline Aus, PA-C 12/07/17 1628    Donnetta Hutching, MD 12/09/17 [phone]

## 2017-12-07 NOTE — Discharge Instructions (Addendum)
Keep the wound clean with mild soap and water and keep it bandaged.  Return to the ER for any signs of infection such as increasing pain, redness, swelling, drainage, fever or chills.

## 2018-02-07 ENCOUNTER — Other Ambulatory Visit (HOSPITAL_COMMUNITY): Payer: Self-pay | Admitting: Pulmonary Disease

## 2018-02-07 ENCOUNTER — Ambulatory Visit (HOSPITAL_COMMUNITY)
Admission: RE | Admit: 2018-02-07 | Discharge: 2018-02-07 | Disposition: A | Discharge status: Home / Self Care | Payer: 59 | Source: Ambulatory Visit | Attending: Pulmonary Disease | Admitting: Pulmonary Disease

## 2018-02-07 DIAGNOSIS — M1712 Unilateral primary osteoarthritis, left knee: Secondary | ICD-10-CM | POA: Diagnosis not present

## 2018-02-07 DIAGNOSIS — M79671 Pain in right foot: Secondary | ICD-10-CM | POA: Insufficient documentation

## 2018-02-07 DIAGNOSIS — I10 Essential (primary) hypertension: Secondary | ICD-10-CM | POA: Diagnosis not present

## 2018-02-07 DIAGNOSIS — R52 Pain, unspecified: Secondary | ICD-10-CM

## 2018-02-07 DIAGNOSIS — M19071 Primary osteoarthritis, right ankle and foot: Secondary | ICD-10-CM | POA: Diagnosis not present

## 2018-02-07 DIAGNOSIS — R739 Hyperglycemia, unspecified: Secondary | ICD-10-CM | POA: Diagnosis not present

## 2018-02-07 LAB — LIPID PANEL
Cholesterol: 189 (ref 0–200)
HDL: 43 (ref 35–70)
LDL Cholesterol: 132
Triglycerides: 58 (ref 40–160)

## 2018-02-07 LAB — HEMOGLOBIN A1C: Hemoglobin A1C: 5.4

## 2018-02-17 LAB — TSH: TSH: 0.64 (ref <=5.90)

## 2018-02-21 ENCOUNTER — Other Ambulatory Visit (HOSPITAL_COMMUNITY): Payer: Self-pay | Admitting: Pulmonary Disease

## 2018-02-21 DIAGNOSIS — R932 Abnormal findings on diagnostic imaging of liver and biliary tract: Secondary | ICD-10-CM

## 2018-02-21 DIAGNOSIS — M79671 Pain in right foot: Secondary | ICD-10-CM

## 2018-03-02 ENCOUNTER — Ambulatory Visit (HOSPITAL_COMMUNITY): Payer: 59

## 2018-03-02 ENCOUNTER — Ambulatory Visit (HOSPITAL_COMMUNITY)
Admission: RE | Admit: 2018-03-02 | Discharge: 2018-03-02 | Disposition: A | Discharge status: Home / Self Care | Payer: 59 | Source: Ambulatory Visit | Attending: Pulmonary Disease | Admitting: Pulmonary Disease

## 2018-03-02 DIAGNOSIS — K760 Fatty (change of) liver, not elsewhere classified: Secondary | ICD-10-CM | POA: Insufficient documentation

## 2018-03-02 DIAGNOSIS — R932 Abnormal findings on diagnostic imaging of liver and biliary tract: Secondary | ICD-10-CM

## 2018-03-02 DIAGNOSIS — K7689 Other specified diseases of liver: Secondary | ICD-10-CM | POA: Insufficient documentation

## 2018-03-03 ENCOUNTER — Ambulatory Visit (HOSPITAL_COMMUNITY): Admission: RE | Admit: 2018-03-03 | Payer: 59 | Source: Ambulatory Visit

## 2018-03-08 ENCOUNTER — Ambulatory Visit (HOSPITAL_COMMUNITY)
Admission: RE | Admit: 2018-03-08 | Discharge: 2018-03-08 | Disposition: A | Discharge status: Home / Self Care | Payer: 59 | Source: Ambulatory Visit | Attending: Pulmonary Disease | Admitting: Pulmonary Disease

## 2018-03-08 DIAGNOSIS — M19071 Primary osteoarthritis, right ankle and foot: Secondary | ICD-10-CM | POA: Insufficient documentation

## 2018-03-08 DIAGNOSIS — M79671 Pain in right foot: Secondary | ICD-10-CM

## 2018-03-08 DIAGNOSIS — M1711 Unilateral primary osteoarthritis, right knee: Secondary | ICD-10-CM | POA: Diagnosis not present

## 2018-03-09 ENCOUNTER — Ambulatory Visit (HOSPITAL_COMMUNITY): Payer: 59

## 2018-03-14 DIAGNOSIS — M79671 Pain in right foot: Secondary | ICD-10-CM | POA: Diagnosis not present

## 2018-03-15 ENCOUNTER — Other Ambulatory Visit (HOSPITAL_COMMUNITY): Payer: Self-pay | Admitting: Pulmonary Disease

## 2018-03-15 DIAGNOSIS — R109 Unspecified abdominal pain: Secondary | ICD-10-CM

## 2018-03-17 ENCOUNTER — Other Ambulatory Visit (HOSPITAL_COMMUNITY): Payer: 59

## 2018-03-20 ENCOUNTER — Encounter (HOSPITAL_COMMUNITY)
Admission: RE | Admit: 2018-03-20 | Discharge: 2018-03-20 | Disposition: A | Discharge status: Home / Self Care | Payer: 59 | Source: Ambulatory Visit | Attending: Pulmonary Disease | Admitting: Pulmonary Disease

## 2018-03-20 DIAGNOSIS — R109 Unspecified abdominal pain: Secondary | ICD-10-CM | POA: Insufficient documentation

## 2018-03-20 DIAGNOSIS — K838 Other specified diseases of biliary tract: Secondary | ICD-10-CM | POA: Diagnosis not present

## 2018-03-20 MED ORDER — TECHNETIUM TC 99M MEBROFENIN IV KIT
5.0000 | PACK | Freq: Once | INTRAVENOUS | Status: AC | PRN
  Administered 2018-03-20: 5 via INTRAVENOUS

## 2018-03-29 DIAGNOSIS — I10 Essential (primary) hypertension: Secondary | ICD-10-CM | POA: Diagnosis not present

## 2018-03-29 DIAGNOSIS — R945 Abnormal results of liver function studies: Secondary | ICD-10-CM | POA: Diagnosis not present

## 2018-03-29 LAB — BASIC METABOLIC PANEL
BUN: 15 (ref 4–21)
CO2: 26 — AB (ref 13–22)
Chloride: 107 (ref 99–108)
Creatinine: 1 (ref <=1.3)
Glucose: 129
Potassium: 4 (ref 3.4–5.3)
Sodium: 141 (ref 137–147)

## 2018-03-29 LAB — HEPATIC FUNCTION PANEL
ALT: 68 — AB (ref 10–40)
AST: 29 (ref 14–40)
Alkaline Phosphatase: 93 (ref 25–125)

## 2018-03-30 LAB — COMPREHENSIVE METABOLIC PANEL
Albumin: 4.3 (ref 3.5–5.0)
Calcium: 8.9 (ref 8.7–10.7)
GFR calc Af Amer: 103
GFR calc non Af Amer: 89
Globulin: 2.2

## 2018-04-12 ENCOUNTER — Ambulatory Visit (INDEPENDENT_AMBULATORY_CARE_PROVIDER_SITE_OTHER): Payer: 59 | Admitting: Internal Medicine

## 2018-04-12 ENCOUNTER — Encounter (INDEPENDENT_AMBULATORY_CARE_PROVIDER_SITE_OTHER): Payer: Self-pay | Admitting: *Deleted

## 2018-04-12 ENCOUNTER — Encounter (INDEPENDENT_AMBULATORY_CARE_PROVIDER_SITE_OTHER): Payer: Self-pay | Admitting: Internal Medicine

## 2018-04-12 VITALS — BP 134/80 | HR 60 | Temp 99.7°F | Ht 72.0 in | Wt 234.4 lb

## 2018-04-12 DIAGNOSIS — K760 Fatty (change of) liver, not elsewhere classified: Secondary | ICD-10-CM

## 2018-04-12 LAB — CBC WITH DIFFERENTIAL/PLATELET
BASOS PCT: 0.5 %
Basophils Absolute: 33 cells/uL (ref 0–200)
EOS ABS: 202 {cells}/uL (ref 15–500)
Eosinophils Relative: 3.1 %
HCT: 43.4 % (ref 38.5–50.0)
Hemoglobin: 14.6 g/dL (ref 13.2–17.1)
Lymphs Abs: 1599 cells/uL (ref 850–3900)
MCH: 30.5 pg (ref 27.0–33.0)
MCHC: 33.6 g/dL (ref 32.0–36.0)
MCV: 90.6 fL (ref 80.0–100.0)
MPV: 10.6 fL (ref 7.5–12.5)
Monocytes Relative: 8.5 %
NEUTROS PCT: 63.3 %
Neutro Abs: 4115 cells/uL (ref 1500–7800)
PLATELETS: 225 10*3/uL (ref 140–400)
RBC: 4.79 10*6/uL (ref 4.20–5.80)
RDW: 12.5 % (ref 11.0–15.0)
Total Lymphocyte: 24.6 %
WBC: 6.5 10*3/uL (ref 3.8–10.8)
WBCMIX: 553 {cells}/uL (ref 200–950)

## 2018-04-12 LAB — HEPATIC FUNCTION PANEL
AG RATIO: 2.2 (calc) (ref 1.0–2.5)
ALKALINE PHOSPHATASE (APISO): 91 U/L (ref 40–115)
ALT: 70 U/L — ABNORMAL HIGH (ref 9–46)
AST: 44 U/L — ABNORMAL HIGH (ref 10–40)
Albumin: 4.9 g/dL (ref 3.6–5.1)
BILIRUBIN TOTAL: 0.8 mg/dL (ref 0.2–1.2)
Bilirubin, Direct: 0.2 mg/dL (ref 0.0–0.2)
GLOBULIN: 2.2 g/dL (ref 1.9–3.7)
Indirect Bilirubin: 0.6 mg/dL (calc) (ref 0.2–1.2)
Total Protein: 7.1 g/dL (ref 6.1–8.1)

## 2018-04-12 NOTE — Patient Instructions (Addendum)
Fatty Liver Fatty liver, also called hepatic steatosis or steatohepatitis, is a condition in which too much fat has built up in your liver cells. The liver removes harmful substances from your bloodstream. It produces fluids your body needs. It also helps your body use and store energy from the food you eat. In many cases, fatty liver does not cause symptoms or problems. It is often diagnosed when tests are being done for other reasons. However, over time, fatty liver can cause inflammation that may lead to more serious liver problems, such as scarring of the liver (cirrhosis). What are the causes? Causes of fatty liver may include:  Drinking too much alcohol.  Poor nutrition.  Obesity.  Cushing syndrome.  Diabetes.  Hyperlipidemia.  Pregnancy.  Certain drugs.  Poisons.  Some viral infections.  What increases the risk? You may be more likely to develop fatty liver if you:  Abuse alcohol.  Are pregnant.  Are overweight.  Have diabetes.  Have hepatitis.  Have a high triglyceride level.  What are the signs or symptoms? Fatty liver often does not cause any symptoms. In cases where symptoms develop, they can include:  Fatigue.  Weakness.  Weight loss.  Confusion.  Abdominal pain.  Yellowing of your skin and the white parts of your eyes (jaundice).  Nausea and vomiting.  How is this diagnosed? Fatty liver may be diagnosed by:  Physical exam and medical history.  Blood tests.  Imaging tests, such as an ultrasound, CT scan, or MRI.  Liver biopsy. A small sample of liver tissue is removed using a needle. The sample is then looked at under a microscope.  How is this treated? Fatty liver is often caused by other health conditions. Treatment for fatty liver may involve medicines and lifestyle changes to manage conditions such as:  Alcoholism.  High cholesterol.  Diabetes.  Being overweight or obese.  Follow these instructions at home:  Eat a  healthy diet as directed by your health care provider.  Exercise regularly. This can help you lose weight and control your cholesterol and diabetes. Talk to your health care provider about an exercise plan and which activities are best for you.  Do not drink alcohol.  Take medicines only as directed by your health care provider. Contact a health care provider if: You have difficulty controlling your:  Blood sugar.  Cholesterol.  Alcohol consumption.  Get help right away if:  You have abdominal pain.  You have jaundice.  You have nausea and vomiting. This information is not intended to replace advice given to you by your health care provider. Make sure you discuss any questions you have with your health care provider. Document Released: 09/10/2005 Document Revised: 01/01/2016 Document Reviewed: 12/05/2013 Elsevier Interactive Patient Education  2018 [employer].  Labs.

## 2018-04-12 NOTE — Progress Notes (Addendum)
   Subjective:    Patient ID: Christopher Lawson, male    DOB: Apr 22, 1971, 47 y.o.   MRN: [id number]  HPI Referred by Dr. Juanetta Gosling for fatty liver. [place] 03/02/2018 revealed fatty liver.   Tumefactive sludge within a partially decompressed gallbladder.  No family hx of fatty liver that he knows of. Father is alive with Parkinson's disease and diabetes. Mother has COPD. His appetite is okay. No weight loss.   03/30/2018 total bili 0.8, ALP 93, AST 29, ALT 68 7/2/20190 total bili 0.7, ALP 97, AST 31.  03/29/2018 Hep A, B, C negative.   03/20/2018 HIDA scan EF 99.   Hx of Factor 5 Deficiency.  Works at [employer]   Review of Systems     Past Medical History:  Diagnosis Date  . Abrasion of right index finger 11/20/2012  . Arthritis    left shoulder - to have surgery 12/12/2012  . Dental crown present   . Deviated nasal septum 11/2012  . 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 Hypertension    under control with meds., has been on med. x 5 yr.  . 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    Past Surgical History:  Procedure Laterality Date  . bicep tendon surgery    . CYST EXCISION     wrist and back (same surgery)  . INGUINAL HERNIA REPAIR Right   . left shoulder surgery    . NASAL SEPTOPLASTY W/ TURBINOPLASTY Bilateral 11/27/2012   Procedure: NASAL SEPTOPLASTY WITH BILATERAL TURBINATE RESECTION;  Surgeon: Darletta Moll, MD;  Location: [facility];  Service: ENT;  Laterality: Bilateral;  . WISDOM TOOTH EXTRACTION      No Known Allergies  Current Outpatient Medications on File Prior to Visit  Medication Sig Dispense Refill  . amLODipine (NORVASC) 10 MG tablet Take 10 mg by mouth daily.    . enalapril (VASOTEC) 10 MG tablet Take 20 mg by mouth daily.     Marland Kitchen ibuprofen (ADVIL,MOTRIN) 200 MG tablet Take 200 mg by mouth every 6 (six) hours as needed.     No current facility-administered medications on file prior to visit.      Objective:    Physical Exam.  Blood pressure 134/80, pulse 60, temperature 99.7 F (37.6 C), height 6' (1.829 m), weight 234 lb 6.4 oz (106.3 kg). Alert and oriented. Skin warm and dry. Oral mucosa is moist.   . Sclera anicteric, conjunctivae is pink. Thyroid not enlarged. No cervical lymphadenopathy. Lungs clear. Heart regular rate and rhythm.  Abdomen is soft. Bowel sounds are positive. No hepatomegaly. No abdominal masses felt. No tenderness.  No edema to lower extremities.           Assessment & Plan:  Fatty liver. Am going to get Hepatic, CBC and [place] elast. Will get labs from Dr. Juanetta Gosling.  Diet and exercise.

## 2018-04-19 ENCOUNTER — Ambulatory Visit (HOSPITAL_COMMUNITY): Payer: 59

## 2018-04-25 DIAGNOSIS — M79671 Pain in right foot: Secondary | ICD-10-CM | POA: Diagnosis not present

## 2018-04-28 ENCOUNTER — Other Ambulatory Visit (HOSPITAL_COMMUNITY): Payer: 59

## 2018-05-01 ENCOUNTER — Encounter (INDEPENDENT_AMBULATORY_CARE_PROVIDER_SITE_OTHER): Payer: Self-pay

## 2018-05-02 ENCOUNTER — Encounter (INDEPENDENT_AMBULATORY_CARE_PROVIDER_SITE_OTHER): Payer: Self-pay

## 2018-05-02 ENCOUNTER — Telehealth (INDEPENDENT_AMBULATORY_CARE_PROVIDER_SITE_OTHER): Payer: Self-pay | Admitting: Internal Medicine

## 2018-05-02 DIAGNOSIS — K760 Fatty (change of) liver, not elsewhere classified: Secondary | ICD-10-CM

## 2018-05-02 NOTE — Telephone Encounter (Signed)
US Elast ordered

## 2018-05-03 ENCOUNTER — Ambulatory Visit (HOSPITAL_COMMUNITY)
Admission: RE | Admit: 2018-05-03 | Discharge: 2018-05-03 | Disposition: A | Discharge status: Home / Self Care | Payer: 59 | Source: Ambulatory Visit | Attending: Internal Medicine | Admitting: Internal Medicine

## 2018-05-03 ENCOUNTER — Telehealth (INDEPENDENT_AMBULATORY_CARE_PROVIDER_SITE_OTHER): Payer: Self-pay | Admitting: Internal Medicine

## 2018-05-03 ENCOUNTER — Ambulatory Visit (HOSPITAL_COMMUNITY): Payer: 59

## 2018-05-03 ENCOUNTER — Other Ambulatory Visit (INDEPENDENT_AMBULATORY_CARE_PROVIDER_SITE_OTHER): Payer: Self-pay | Admitting: *Deleted

## 2018-05-03 DIAGNOSIS — K760 Fatty (change of) liver, not elsewhere classified: Secondary | ICD-10-CM

## 2018-05-03 NOTE — Telephone Encounter (Signed)
I have spoken with patient 

## 2018-05-03 NOTE — Telephone Encounter (Signed)
Please call patient at [phone]

## 2018-05-16 DIAGNOSIS — E669 Obesity, unspecified: Secondary | ICD-10-CM | POA: Diagnosis not present

## 2018-05-16 DIAGNOSIS — I10 Essential (primary) hypertension: Secondary | ICD-10-CM | POA: Diagnosis not present

## 2018-05-16 DIAGNOSIS — K760 Fatty (change of) liver, not elsewhere classified: Secondary | ICD-10-CM | POA: Diagnosis not present

## 2018-06-05 ENCOUNTER — Encounter (INDEPENDENT_AMBULATORY_CARE_PROVIDER_SITE_OTHER): Payer: Self-pay | Admitting: *Deleted

## 2018-06-05 ENCOUNTER — Other Ambulatory Visit (INDEPENDENT_AMBULATORY_CARE_PROVIDER_SITE_OTHER): Payer: Self-pay | Admitting: *Deleted

## 2018-06-05 DIAGNOSIS — K760 Fatty (change of) liver, not elsewhere classified: Secondary | ICD-10-CM

## 2018-06-28 DIAGNOSIS — K760 Fatty (change of) liver, not elsewhere classified: Secondary | ICD-10-CM | POA: Diagnosis not present

## 2018-06-28 LAB — HEPATIC FUNCTION PANEL
AG RATIO: 1.8 (calc) (ref 1.0–2.5)
ALT: 66 U/L — ABNORMAL HIGH (ref 9–46)
AST: 39 U/L (ref 10–40)
Albumin: 4.3 g/dL (ref 3.6–5.1)
Alkaline phosphatase (APISO): 83 U/L (ref 40–115)
BILIRUBIN DIRECT: 0.2 mg/dL (ref 0.0–0.2)
BILIRUBIN TOTAL: 0.8 mg/dL (ref 0.2–1.2)
Globulin: 2.4 g/dL (calc) (ref 1.9–3.7)
Indirect Bilirubin: 0.6 mg/dL (calc) (ref 0.2–1.2)
Total Protein: 6.7 g/dL (ref 6.1–8.1)

## 2018-07-03 ENCOUNTER — Other Ambulatory Visit (INDEPENDENT_AMBULATORY_CARE_PROVIDER_SITE_OTHER): Payer: Self-pay | Admitting: *Deleted

## 2018-07-03 DIAGNOSIS — K760 Fatty (change of) liver, not elsewhere classified: Secondary | ICD-10-CM

## 2018-10-26 ENCOUNTER — Encounter (INDEPENDENT_AMBULATORY_CARE_PROVIDER_SITE_OTHER): Payer: Self-pay

## 2018-11-01 ENCOUNTER — Ambulatory Visit (INDEPENDENT_AMBULATORY_CARE_PROVIDER_SITE_OTHER): Payer: 59 | Admitting: Internal Medicine

## 2018-11-09 DIAGNOSIS — M25522 Pain in left elbow: Secondary | ICD-10-CM | POA: Diagnosis not present

## 2018-11-09 DIAGNOSIS — M7522 Bicipital tendinitis, left shoulder: Secondary | ICD-10-CM | POA: Diagnosis not present

## 2018-11-29 ENCOUNTER — Other Ambulatory Visit (INDEPENDENT_AMBULATORY_CARE_PROVIDER_SITE_OTHER): Payer: Self-pay | Admitting: *Deleted

## 2018-11-29 ENCOUNTER — Encounter (INDEPENDENT_AMBULATORY_CARE_PROVIDER_SITE_OTHER): Payer: Self-pay | Admitting: *Deleted

## 2018-11-29 DIAGNOSIS — K760 Fatty (change of) liver, not elsewhere classified: Secondary | ICD-10-CM

## 2018-11-29 NOTE — Progress Notes (Signed)
Order complete. 

## 2018-12-04 ENCOUNTER — Other Ambulatory Visit: Payer: Self-pay

## 2018-12-04 ENCOUNTER — Ambulatory Visit (INDEPENDENT_AMBULATORY_CARE_PROVIDER_SITE_OTHER): Payer: 59 | Admitting: Internal Medicine

## 2018-12-04 ENCOUNTER — Encounter (INDEPENDENT_AMBULATORY_CARE_PROVIDER_SITE_OTHER): Payer: Self-pay | Admitting: Internal Medicine

## 2018-12-04 DIAGNOSIS — K760 Fatty (change of) liver, not elsewhere classified: Secondary | ICD-10-CM | POA: Diagnosis not present

## 2018-12-04 NOTE — Patient Instructions (Signed)
Labs tomorrow. OV in 6 months.

## 2018-12-04 NOTE — Progress Notes (Signed)
   Subjective:    Patient ID: Christopher Lawson, male    DOB: [birth year], 48 y.o.   MRN: [id number] Start Time 9:30am -940am Total time 10 minutes.  Reason for visit fatty liver. This is a telephone OV. Patient consents to talk with me. He is at home. I am in the office. Telephone OV due to the risk of COVID-19.  Last weight was 234 in September of 2019. He is staying busy. He works at the [employer].  His weight was 230 this am.  He has lost four pounds. His appetite is good. Has a BM daily. No melena or BRRB. He is trying to exercise.  He is not on any particular diet.      05/03/2018 [place] Elast of liver F2-F3.  Hepatic Function Latest Ref Rng & Units 06/28/2018 04/12/2018  Total Protein 6.1 - 8.1 g/dL 6.7 7.1  AST 10 - 40 U/L 39 44(H)  ALT 9 - 46 U/L 66(H) 70(H)  Total Bilirubin 0.2 - 1.2 mg/dL 0.8 0.8  Bilirubin, Direct 0.0 - 0.2 mg/dL 0.2 0.2     5/43/6067 total bili 0.8, ALP 93, AST 29, ALT 68 7/2/20190 total bili 0.7, ALP 97, AST 31.  03/29/2018 Hep A, B, C negative.   03/20/2018 HIDA scan EF 99.   Hx of Factor 5 Deficiency.  Works at [employer]                   Review of Systems     Past Medical History:  Diagnosis Date  . Abrasion of right index finger 11/20/2012  . Arthritis    left shoulder - to have surgery 12/12/2012  . Dental crown present   . Deviated nasal septum 11/2012  . 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 Fatty liver   . Hypertension    under control with meds., has been on med. x 5 yr.  . 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    Past Surgical History:  Procedure Laterality Date  . bicep tendon surgery    . CYST EXCISION     wrist and back (same surgery)  . INGUINAL HERNIA REPAIR Right   . left shoulder surgery    . NASAL SEPTOPLASTY W/ TURBINOPLASTY Bilateral 11/27/2012   Procedure: NASAL SEPTOPLASTY WITH BILATERAL TURBINATE RESECTION;  Surgeon: Darletta Moll, MD;  Location: [facility];  Service: ENT;  Laterality: Bilateral;  . WISDOM TOOTH EXTRACTION      No Known Allergies  Current Outpatient Medications on File Prior to Visit  Medication Sig Dispense Refill  . amLODipine (NORVASC) 10 MG tablet Take 10 mg by mouth daily.    . enalapril (VASOTEC) 10 MG tablet Take 20 mg by mouth daily.     Marland Kitchen ibuprofen (ADVIL,MOTRIN) 200 MG tablet Take 200 mg by mouth every 6 (six) hours as needed.     No current facility-administered medications on file prior to visit.      Objective:   Physical Exam  Deferred.      Assessment & Plan:  Fatty liver. Liver numbers are slightly elevated. He will go ahead and have his lab drawn (Hepatic). OV in 6 months.

## 2018-12-05 DIAGNOSIS — K760 Fatty (change of) liver, not elsewhere classified: Secondary | ICD-10-CM | POA: Diagnosis not present

## 2018-12-06 LAB — HEPATIC FUNCTION PANEL
AG Ratio: 1.8 (calc) (ref 1.0–2.5)
ALT: 61 U/L — ABNORMAL HIGH (ref 9–46)
AST: 32 U/L (ref 10–40)
Albumin: 4.4 g/dL (ref 3.6–5.1)
Alkaline phosphatase (APISO): 87 U/L (ref 36–130)
Bilirubin, Direct: 0.2 mg/dL (ref 0.0–0.2)
Globulin: 2.4 g/dL (calc) (ref 1.9–3.7)
Indirect Bilirubin: 0.7 mg/dL (calc) (ref 0.2–1.2)
Total Bilirubin: 0.9 mg/dL (ref 0.2–1.2)
Total Protein: 6.8 g/dL (ref 6.1–8.1)

## 2018-12-27 DIAGNOSIS — E785 Hyperlipidemia, unspecified: Secondary | ICD-10-CM | POA: Diagnosis not present

## 2018-12-27 DIAGNOSIS — I10 Essential (primary) hypertension: Secondary | ICD-10-CM | POA: Diagnosis not present

## 2018-12-27 DIAGNOSIS — K760 Fatty (change of) liver, not elsewhere classified: Secondary | ICD-10-CM | POA: Diagnosis not present

## 2019-05-30 ENCOUNTER — Encounter (INDEPENDENT_AMBULATORY_CARE_PROVIDER_SITE_OTHER): Payer: Self-pay

## 2019-06-02 NOTE — Progress Notes (Addendum)
Subjective:    Patient ID: Christopher Lawson, male    DOB: 04/19/1971, 48 y.o.   MRN: [id number]  HPI Christopher Lawson is a 48 year old male with a past medical history of Leiden factor V deficiency, hypertension, arthritis, colon polyp and a fatty liver.  He presents today for his annual follow-up.  Most recent laboratory studies 12/05/2018 showed AST 32.  ALT 61. Labs 03/29/2018 Hepatitis B surface antigen negative, hepatitis C antibody negative and  Hepatitis A IgM negative. An abdominal ultrasound with elastography  05/03/2018  showed fibrosis score F2, F3. He drinks 2 to 3 ( 20 ounce)  sodas several days weekly.  When he is stressed,  he drinks sodas daily. No alcohol. He is active at work, walks a lot. He does not exercise on a regular basis.  His maternal uncle died from cirrhosis.  He reports having a colonoscopy approximately 10 years ago due to rectal bleeding.  He stated 1 polyp was removed.  Had a repeat colonoscopy a few years later and no polyps were found.  He thinks he had the colonoscopy done by [facility] in [place].  I will request a copy of these results for further review.  No known family history of colorectal cancer.  He is passing normal formed brown bowel movement once daily.  He denies having any recent rectal bleeding.  No melena.  He infrequently takes NSAIDs.  His weight is stable, he has not lost weight or gained weight.  He weighs 231 pounds today.  Abdominal ultrasound with elastography 05/03/2018: Liver: Echogenic liver compatible with hepatic steatosis. Elastography: Median hepatic shear wave velocity is calculated at 1.29 m/sec. Corresponding Metavir fibrosis score is F2 + some F3. Risk of fibrosis is moderate. Follow-up: Additional testing appropriate  Hepatic Function Latest Ref Rng & Units 12/05/2018 06/28/2018 04/12/2018  Total Protein 6.1 - 8.1 g/dL 6.8 6.7 7.1  AST 10 - 40 U/L 32 39 44(H)  ALT 9 - 46 U/L 61(H) 66(H) 70(H)  Total Bilirubin 0.2 - 1.2 mg/dL 0.9  0.8 0.8  Bilirubin, Direct 0.0 - 0.2 mg/dL 0.2 0.2 0.2   CBC Latest Ref Rng & Units 04/12/2018 11/27/2012  WBC 3.8 - 10.8 Thousand/uL 6.5 -  Hemoglobin 13.2 - 17.1 g/dL 77.8 24.2  Hematocrit 35.3 - 50.0 % 43.4 -  Platelets 140 - 400 Thousand/uL 225 -    Past Medical History:  Diagnosis Date  . Abrasion of right index finger 11/20/2012  . Arthritis    left shoulder - to have surgery 12/12/2012  . Dental crown present   . Deviated nasal septum 11/2012  . 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 Fatty liver   . Hypertension    under control with meds., has been on med. x 5 yr.  . 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   Past Surgical History:  Procedure Laterality Date  . bicep tendon surgery    . CYST EXCISION     wrist and back (same surgery)  . INGUINAL HERNIA REPAIR Right   . left shoulder surgery    . NASAL SEPTOPLASTY W/ TURBINOPLASTY Bilateral 11/27/2012   Procedure: NASAL SEPTOPLASTY WITH BILATERAL TURBINATE RESECTION;  Surgeon: Darletta Moll, MD;  Location: [facility];  Service: ENT;  Laterality: Bilateral;  . WISDOM TOOTH EXTRACTION      Current Outpatient Medications on File Prior to Visit  Medication Sig Dispense Refill  . amLODipine (NORVASC) 10  MG tablet Take 10 mg by mouth daily.    . enalapril (VASOTEC) 10 MG tablet Take 20 mg by mouth daily.     Marland Kitchen ibuprofen (ADVIL,MOTRIN) 200 MG tablet Take 200 mg by mouth every 6 (six) hours as needed.     No current facility-administered medications on file prior to visit.    No Known Allergies  Review of Systems see HPI, all other systems reviewed and are negative     Objective:   Physical Exam  BP 131/84   Pulse 62   Temp 98.3 F (36.8 C)   Ht 5\' 8"  (1.727 m)   Wt 231 lb 14.4 oz (105.2 kg)   BMI 35.26 kg/m  General: 48 year old male well-developed in no acute distress Eyes: Sclera nonicteric, conjunctiva pink Mouth: Dentition intact Neck: Supple, no lymphadenopathy Heart:  Regular rate and rhythm, no murmurs Lungs: Breath sounds clear throughout Abdomen: Soft, nontender, no masses, the liver border is palpated with deep inspiration, no splenomegaly, positive bowel sounds to all 4 quadrants Extremities: No edema Neuro: Alert and oriented x4, no focal deficits    Assessment & Plan:    1. Hepatic steatosis with elevated ALT. -Hepatic panel, BMP, hepatitis A antibody total, hepatitis B surface antibody, ceruloplasmin, alpha-1 antitrypsin, GGT, anti-smooth muscle antibody, mitochondrial antibody, IgG -If he does not show immunity to hepatitis a and B he will need his vaccinations -Advised the patient to completely wean off sodas.  I explained the fructose/corn syrup and sodas turns to fat in the liver.  Reduce carbohydrate intake. -Exercise 45 minutes 3 to 4 days weekly -Repeat abdominal ultrasound with elastography in 1 year  2.  History of colon polyp per the patient's report.  Last colonoscopy was at least 10 years ago. -Colonoscopy benefits and risk discussed including risk with sedation, risk of bleeding, perforation and infection  3.  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

## 2019-06-05 ENCOUNTER — Other Ambulatory Visit: Payer: Self-pay

## 2019-06-05 ENCOUNTER — Ambulatory Visit (INDEPENDENT_AMBULATORY_CARE_PROVIDER_SITE_OTHER): Payer: 59 | Admitting: Nurse Practitioner

## 2019-06-05 ENCOUNTER — Encounter (INDEPENDENT_AMBULATORY_CARE_PROVIDER_SITE_OTHER): Payer: Self-pay | Admitting: Nurse Practitioner

## 2019-06-05 ENCOUNTER — Telehealth (INDEPENDENT_AMBULATORY_CARE_PROVIDER_SITE_OTHER): Payer: Self-pay | Admitting: Nurse Practitioner

## 2019-06-05 DIAGNOSIS — R7989 Other specified abnormal findings of blood chemistry: Secondary | ICD-10-CM | POA: Diagnosis not present

## 2019-06-05 DIAGNOSIS — Z860100 Personal history of colon polyps, unspecified: Secondary | ICD-10-CM | POA: Insufficient documentation

## 2019-06-05 DIAGNOSIS — K760 Fatty (change of) liver, not elsewhere classified: Secondary | ICD-10-CM | POA: Diagnosis not present

## 2019-06-05 NOTE — Telephone Encounter (Signed)
Christopher Lawson please enter recall for abdominal sonogram with elastography Sept. 2021 thx

## 2019-06-05 NOTE — Patient Instructions (Addendum)
1. Complete the ordered blood tests  2. Reduce your soda intake in half then gradually to none. Reduce white bread, white rice ie: carbohydrate intake. Exercise 45 minutes 3 to 4 days weekly.  3. Repeat abdominal sonogram with elastography in 1 year   4. Follow up in the office in 6 months

## 2019-06-06 NOTE — Telephone Encounter (Signed)
Noted in recall 

## 2019-06-06 NOTE — Addendum Note (Signed)
Addended by: Rogene Houston on: 06/06/2019 11:12 PM   Modules accepted: Orders

## 2019-06-08 LAB — HEPATIC FUNCTION PANEL
AG Ratio: 1.8 (calc) (ref 1.0–2.5)
ALT: 53 U/L — ABNORMAL HIGH (ref 9–46)
AST: 25 U/L (ref 10–40)
Albumin: 4.5 g/dL (ref 3.6–5.1)
Alkaline phosphatase (APISO): 92 U/L (ref 36–130)
Bilirubin, Direct: 0.1 mg/dL (ref 0.0–0.2)
Globulin: 2.5 g/dL (calc) (ref 1.9–3.7)
Indirect Bilirubin: 0.5 mg/dL (calc) (ref 0.2–1.2)
Total Bilirubin: 0.6 mg/dL (ref 0.2–1.2)
Total Protein: 7 g/dL (ref 6.1–8.1)

## 2019-06-08 LAB — CERULOPLASMIN: Ceruloplasmin: 28 mg/dL (ref 18–36)

## 2019-06-08 LAB — BASIC METABOLIC PANEL
BUN: 14 mg/dL (ref 7–25)
CO2: 26 mmol/L (ref 20–32)
Calcium: 9.5 mg/dL (ref 8.6–10.3)
Chloride: 107 mmol/L (ref 98–110)
Creat: 0.95 mg/dL (ref 0.60–1.35)
Glucose, Bld: 105 mg/dL (ref 65–139)
Potassium: 4.8 mmol/L (ref 3.5–5.3)
Sodium: 141 mmol/L (ref 135–146)

## 2019-06-08 LAB — ALPHA-1-ANTITRYPSIN: A-1 Antitrypsin, Ser: 86 mg/dL (ref 83–199)

## 2019-06-08 LAB — ANTI-SMOOTH MUSCLE ANTIBODY, IGG: Actin (Smooth Muscle) Antibody (IGG): 51 U — ABNORMAL HIGH (ref <20)

## 2019-06-08 LAB — MITOCHONDRIAL ANTIBODIES: Mitochondrial M2 Ab, IgG: <=20 U

## 2019-06-08 LAB — HEPATITIS B SURFACE ANTIBODY,QUALITATIVE: Hep B S Ab: NONREACTIVE

## 2019-06-08 LAB — GAMMA GT: GGT: 29 U/L (ref 3–95)

## 2019-06-08 LAB — IGG: IgG (Immunoglobin G), Serum: 1026 mg/dL (ref 600–1640)

## 2019-06-08 LAB — HEPATITIS A ANTIBODY, TOTAL: Hepatitis A AB,Total: NONREACTIVE

## 2019-06-13 ENCOUNTER — Telehealth: Payer: Self-pay | Admitting: Nurse Practitioner

## 2019-06-13 NOTE — Telephone Encounter (Signed)
error 

## 2019-06-14 ENCOUNTER — Telehealth: Payer: Self-pay | Admitting: Nurse Practitioner

## 2019-06-14 ENCOUNTER — Other Ambulatory Visit: Payer: Self-pay | Admitting: Nurse Practitioner

## 2019-06-14 DIAGNOSIS — K760 Fatty (change of) liver, not elsewhere classified: Secondary | ICD-10-CM

## 2019-06-14 DIAGNOSIS — R7989 Other specified abnormal findings of blood chemistry: Secondary | ICD-10-CM

## 2019-06-14 NOTE — Telephone Encounter (Signed)
[name], can you contact pt to schedule an abdominal MRI/MRCP, orders entered. Pls let me know if not authorized by his insurance. thx

## 2019-06-18 NOTE — Telephone Encounter (Signed)
MRCP sch'd 06/25/19 at 100 (1230), npo 4 hours, left detailed message for patient with appt info

## 2019-06-21 ENCOUNTER — Ambulatory Visit (HOSPITAL_COMMUNITY)
Admission: RE | Admit: 2019-06-21 | Discharge: 2019-06-21 | Disposition: A | Discharge status: Home / Self Care | Payer: 59 | Source: Ambulatory Visit | Attending: Nurse Practitioner | Admitting: Nurse Practitioner

## 2019-06-21 ENCOUNTER — Other Ambulatory Visit: Payer: Self-pay | Admitting: Nurse Practitioner

## 2019-06-21 ENCOUNTER — Other Ambulatory Visit: Payer: Self-pay

## 2019-06-21 DIAGNOSIS — K760 Fatty (change of) liver, not elsewhere classified: Secondary | ICD-10-CM | POA: Diagnosis not present

## 2019-06-21 DIAGNOSIS — R7989 Other specified abnormal findings of blood chemistry: Secondary | ICD-10-CM

## 2019-06-21 MED ORDER — GADOBUTROL 1 MMOL/ML IV SOLN
10.0000 mL | Freq: Once | INTRAVENOUS | Status: AC | PRN
  Administered 2019-06-21: 10 mL via INTRAVENOUS

## 2019-06-25 ENCOUNTER — Ambulatory Visit (HOSPITAL_COMMUNITY): Payer: 59

## 2019-06-26 NOTE — Progress Notes (Signed)
Other

## 2019-06-27 ENCOUNTER — Encounter: Payer: Self-pay | Admitting: Family Medicine

## 2019-06-27 ENCOUNTER — Other Ambulatory Visit: Payer: Self-pay

## 2019-06-27 ENCOUNTER — Ambulatory Visit: Payer: 59 | Admitting: Family Medicine

## 2019-06-27 VITALS — BP 137/88 | HR 68 | Temp 98.5°F | Ht 70.0 in | Wt 234.6 lb

## 2019-06-27 DIAGNOSIS — I10 Essential (primary) hypertension: Secondary | ICD-10-CM | POA: Diagnosis not present

## 2019-06-27 LAB — POCT URINALYSIS DIP (CLINITEK)
Bilirubin, UA: NEGATIVE
Blood, UA: NEGATIVE
Glucose, UA: NEGATIVE mg/dL
Ketones, POC UA: NEGATIVE mg/dL
Leukocytes, UA: NEGATIVE
Nitrite, UA: NEGATIVE
POC PROTEIN,UA: NEGATIVE
Spec Grav, UA: >=1.03 — AB (ref 1.010–1.025)
Urobilinogen, UA: 0.2 E.U./dL
pH, UA: 6.5 (ref 5.0–8.0)

## 2019-06-27 NOTE — Progress Notes (Signed)
New Patient Office Visit  Subjective:  Patient ID: Christopher Lawson, male    DOB: [birth year]  Age: 48 y.o. MRN: [id number]  CC:  Chief Complaint  Patient presents with  . Establish Care    HPI Christopher Lawson presents for HTN Fatty liver-followed by GI-polyps  Past Medical History:  Diagnosis Date  . Abrasion of right index finger 11/20/2012  . Arthritis    left shoulder - to have surgery 12/12/2012  . Cellulitis of left lower limb   . Dental crown present   . Deviated nasal septum 11/2012  . Essential (primary) hypertension   . Factor V deficiency ([place])    states never had a problem  . Fatty (change of) liver, not elsewhere classified   . Fatty liver   . Hypertension    under control with meds., has been on med. x 5 yr.  . 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  . Obesity, unspecified   . Pain in left knee   . Pain in right foot   . Pain in right shoulder   . Sebaceous cyst   . Spontaneous rupture of other tendons, right upper arm   . Tinnitus, right ear   . Unilateral primary osteoarthritis, left knee     Past Surgical History:  Procedure Laterality Date  . bicep tendon surgery    . CYST EXCISION     wrist and back (same surgery)  . INGUINAL HERNIA REPAIR Right   . left shoulder surgery    . NASAL SEPTOPLASTY W/ TURBINOPLASTY Bilateral 11/27/2012   Procedure: NASAL SEPTOPLASTY WITH BILATERAL TURBINATE RESECTION;  Surgeon: Ascencion Dike, MD;  Location: [facility];  Service: ENT;  Laterality: Bilateral;  . WISDOM TOOTH EXTRACTION      No family history on file.  Social History   Socioeconomic History  . Marital status: Married    Spouse name: Not on file  . Number of children: Not on file  . Years of education: Not on file  . Highest education level: Not on file  Occupational History  . Not on file  Social Needs  . Financial resource strain: Not on file  . Food insecurity    Worry: Not on file    Inability: Not on file  .  Transportation needs    Medical: Not on file    Non-medical: Not on file  Tobacco Use  . Smoking status: Never Smoker  . Smokeless tobacco: Never Used  Substance and Sexual Activity  . Alcohol use: Yes    Comment: occasionally  . Drug use: No  . Sexual activity: Not on file  Lifestyle  . Physical activity    Days per week: Not on file    Minutes per session: Not on file  . Stress: Not on file  Relationships  . Social Herbalist on phone: Not on file    Gets together: Not on file    Attends religious service: Not on file    Active member of club or organization: Not on file    Attends meetings of clubs or organizations: Not on file    Relationship status: Not on file  . Intimate partner violence    Fear of current or ex partner: Not on file    Emotionally abused: Not on file    Physically abused: Not on file    Forced sexual activity: Not on file  Other Topics Concern  . Not on file  Social  History Narrative  . Not on file    ROS Review of Systems  HENT: Positive for hearing loss. Negative for sinus pain.   Eyes:       Glasses  Respiratory: Negative.   Cardiovascular: Negative.   Gastrointestinal:       Colonoscopy -scheduled 21  Endocrine: Negative.   Genitourinary: Negative.   Musculoskeletal: Positive for arthralgias.       Torn ligaments  Skin:       Skin lesions-no malignant  Allergic/Immunologic: Negative.   Neurological: Negative.   Psychiatric/Behavioral: Negative.     Objective:   Today's Vitals: BP 137/88   Pulse 68   Temp 98.5 F (36.9 C) (Oral)   Ht 5\' 10"  (1.778 m)   Wt 234 lb 9.6 oz (106.4 kg)   SpO2 97%   BMI 33.66 kg/m   Physical Exam Constitutional:      Appearance: Normal appearance. He is normal weight.  HENT:     Head: Normocephalic and atraumatic.     Right Ear: Tympanic membrane, ear canal and external ear normal.     Left Ear: Tympanic membrane, ear canal and external ear normal.     Nose: Nose normal.      Mouth/Throat:     Mouth: Mucous membranes are moist.  Eyes:     Conjunctiva/sclera: Conjunctivae normal.  Neck:     Musculoskeletal: Normal range of motion and neck supple.  Cardiovascular:     Rate and Rhythm: Normal rate and regular rhythm.     Pulses: Normal pulses.     Heart sounds: Normal heart sounds.  Pulmonary:     Effort: Pulmonary effort is normal.     Breath sounds: Normal breath sounds.  Abdominal:     Palpations: Abdomen is soft.  Musculoskeletal: Normal range of motion.  Neurological:     Mental Status: He is alert and oriented to person, place, and time.  Psychiatric:        Mood and Affect: Mood normal.        Behavior: Behavior normal.     Assessment & Plan:    Outpatient Encounter Medications as of 06/27/2019  Medication Sig  . amLODipine (NORVASC) 10 MG tablet Take 10 mg by mouth daily.  . enalapril (VASOTEC) 10 MG tablet Take 20 mg by mouth daily.   06/29/2019 ibuprofen (ADVIL,MOTRIN) 200 MG tablet Take 200 mg by mouth every 6 (six) hours as needed.   No facility-administered encounter medications on file as of 06/27/2019.   1. Hypertension, unspecified type Continue amlodipine/vasotec-stable U/a Reviewed bmp Exercise -139min-moderate execise Lipid panel 19 normal  Follow-up:   Dalores Weger 44m, MD

## 2019-06-27 NOTE — Patient Instructions (Signed)

## 2019-07-11 ENCOUNTER — Encounter (INDEPENDENT_AMBULATORY_CARE_PROVIDER_SITE_OTHER): Payer: Self-pay | Admitting: *Deleted

## 2019-07-11 ENCOUNTER — Telehealth (INDEPENDENT_AMBULATORY_CARE_PROVIDER_SITE_OTHER): Payer: Self-pay | Admitting: *Deleted

## 2019-07-11 DIAGNOSIS — Z86010 Personal history of colonic polyps: Secondary | ICD-10-CM

## 2019-07-11 MED ORDER — SUPREP BOWEL PREP KIT 17.5-3.13-1.6 GM/177ML PO SOLN: 1.0000 | Freq: Once | ORAL | 0 refills | Status: AC

## 2019-07-11 NOTE — Telephone Encounter (Signed)
Patient needs suprep TCS sch'd 1/11

## 2019-07-19 ENCOUNTER — Encounter (INDEPENDENT_AMBULATORY_CARE_PROVIDER_SITE_OTHER): Payer: Self-pay | Admitting: *Deleted

## 2019-07-24 ENCOUNTER — Other Ambulatory Visit (INDEPENDENT_AMBULATORY_CARE_PROVIDER_SITE_OTHER): Payer: Self-pay | Admitting: *Deleted

## 2019-07-26 ENCOUNTER — Telehealth (INDEPENDENT_AMBULATORY_CARE_PROVIDER_SITE_OTHER): Payer: Self-pay | Admitting: *Deleted

## 2019-07-26 NOTE — Telephone Encounter (Signed)
Pls enter a 6 month colonoscopy recall thx

## 2019-07-26 NOTE — Telephone Encounter (Signed)
Patient called in and canceled TCS for 08/22/19 - said he would call back to resch'd once he paid some stuff off

## 2019-08-13 ENCOUNTER — Other Ambulatory Visit: Payer: Self-pay

## 2019-08-13 ENCOUNTER — Ambulatory Visit: Payer: 59 | Attending: Internal Medicine

## 2019-08-13 DIAGNOSIS — Z20822 Contact with and (suspected) exposure to covid-19: Secondary | ICD-10-CM

## 2019-08-14 LAB — NOVEL CORONAVIRUS, NAA: SARS-CoV-2, NAA: DETECTED — AB

## 2019-08-16 ENCOUNTER — Encounter: Payer: Self-pay | Admitting: Family Medicine

## 2019-08-16 MED ORDER — ENALAPRIL MALEATE 10 MG PO TABS: 20.0000 mg | ORAL_TABLET | Freq: Every day | ORAL | 1 refills | Status: DC

## 2019-08-20 ENCOUNTER — Other Ambulatory Visit (HOSPITAL_COMMUNITY): Payer: 59

## 2019-08-22 ENCOUNTER — Encounter (HOSPITAL_COMMUNITY): Payer: Self-pay

## 2019-08-22 ENCOUNTER — Ambulatory Visit (HOSPITAL_COMMUNITY): Admit: 2019-08-22 | Payer: 59 | Admitting: Internal Medicine

## 2019-08-22 DIAGNOSIS — Z86010 Personal history of colonic polyps: Secondary | ICD-10-CM

## 2019-08-22 SURGERY — COLONOSCOPY
Anesthesia: Moderate Sedation

## 2019-10-07 ENCOUNTER — Other Ambulatory Visit: Payer: Self-pay | Admitting: Family Medicine

## 2019-10-12 ENCOUNTER — Other Ambulatory Visit: Payer: Self-pay | Admitting: Family Medicine

## 2019-10-13 ENCOUNTER — Encounter: Payer: Self-pay | Admitting: Family Medicine

## 2019-10-13 MED ORDER — AMLODIPINE BESYLATE 10 MG PO TABS: 10.0000 mg | ORAL_TABLET | Freq: Every day | ORAL | 1 refills | Status: DC

## 2019-12-11 ENCOUNTER — Encounter: Payer: Self-pay | Admitting: Nurse Practitioner

## 2019-12-11 ENCOUNTER — Other Ambulatory Visit: Payer: Self-pay

## 2019-12-11 ENCOUNTER — Ambulatory Visit (INDEPENDENT_AMBULATORY_CARE_PROVIDER_SITE_OTHER): Payer: 59 | Admitting: Nurse Practitioner

## 2019-12-11 VITALS — BP 136/72 | HR 77 | Temp 98.6°F | Resp 15 | Ht 70.0 in | Wt 234.2 lb

## 2019-12-11 DIAGNOSIS — K760 Fatty (change of) liver, not elsewhere classified: Secondary | ICD-10-CM | POA: Diagnosis not present

## 2019-12-11 DIAGNOSIS — Z0001 Encounter for general adult medical examination with abnormal findings: Secondary | ICD-10-CM | POA: Diagnosis not present

## 2019-12-11 DIAGNOSIS — I10 Essential (primary) hypertension: Secondary | ICD-10-CM

## 2019-12-11 DIAGNOSIS — Z13220 Encounter for screening for lipoid disorders: Secondary | ICD-10-CM | POA: Diagnosis not present

## 2019-12-11 NOTE — Progress Notes (Signed)
New Patient Office Visit  Subjective:  Patient ID: Christopher Lawson, male    DOB: Apr 27, 1971  Age: 49 y.o. MRN: [id number]  CC:  Chief Complaint  Patient presents with  . Establish Care    HPI Christopher Lawson is a 49 year old male presenting to establish care. His PCP retired. He has h/o fatty liver and HTN that he reports has been stable for years on same medications. Last labs in October. He reports asymptomatic COVID in Dec. He is not considering COVID vaccine. Discussed and pt not compelled. HE work at [employer]. Discussed and educated on cholesterol, HTN BP goal <140/90 with 3 our post taking blood pressure meds home bp checks and bringing to clinic, eating low fat cholesterol and getting exercise along with drinking plenty of water daily. He v.l u/s. No cp/ct, gu/gi , pain, sob, edema, falls.  Past Medical History:  Diagnosis Date  . Abrasion of right index finger 11/20/2012  . Arthritis    left shoulder - to have surgery 12/12/2012  . Cellulitis of left lower limb   . Dental crown present   . Deviated nasal septum 11/2012  . Essential (primary) hypertension   . 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 Fatty (change of) liver, not elsewhere classified   . Fatty liver   . Hypertension    under control with meds., has been on med. x 5 yr.  . 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  . Obesity, unspecified   . Pain in left knee   . Pain in right foot   . Pain in right shoulder   . Sebaceous cyst   . Spontaneous rupture of other tendons, right upper arm   . Tinnitus, right ear   . Unilateral primary osteoarthritis, left knee     Past Surgical History:  Procedure Laterality Date  . bicep tendon surgery    . CYST EXCISION     wrist and back (same surgery)  . INGUINAL HERNIA REPAIR Right   . left shoulder surgery    . NASAL SEPTOPLASTY W/ TURBINOPLASTY Bilateral 11/27/2012   Procedure: NASAL SEPTOPLASTY WITH BILATERAL TURBINATE  RESECTION;  Surgeon: Darletta Moll, MD;  Location: [facility];  Service: ENT;  Laterality: Bilateral;  . WISDOM TOOTH EXTRACTION      No family history on file.  Social History   Socioeconomic History  . Marital status: Married    Spouse name: Not on file  . Number of children: Not on file  . Years of education: Not on file  . Highest education level: Not on file  Occupational History  . Not on file  Tobacco Use  . Smoking status: Never Smoker  . Smokeless tobacco: Never Used  Substance and Sexual Activity  . Alcohol use: Yes    Comment: occasionally  . Drug use: No  . Sexual activity: Not on file  Other Topics Concern  . Not on file  Social History Narrative  . Not on file   Social Determinants of Health   Financial Resource Strain:   . Difficulty of Paying Living Expenses:   Food Insecurity:   . Worried About Programme researcher, broadcasting/film/video in the Last Year:   . Barista in the Last Year:   Transportation Needs:   . Freight forwarder (Medical):   Marland Kitchen Lack of Transportation (Non-Medical):   Physical Activity:   . Days of Exercise per  Week:   . Minutes of Exercise per Session:   Stress:   . Feeling of Stress :   Social Connections:   . Frequency of Communication with Friends and Family:   . Frequency of Social Gatherings with Friends and Family:   . Attends Religious Services:   . Active Member of Clubs or Organizations:   . Attends Archivist Meetings:   Marland Kitchen Marital Status:   Intimate Partner Violence:   . Fear of Current or Ex-Partner:   . Emotionally Abused:   Marland Kitchen Physically Abused:   . Sexually Abused:     ROS Review of Systems  All other systems reviewed and are negative.   Objective:   Today's Vitals: BP 136/72   Pulse 77   Temp 98.6 F (37 C) (Temporal)   Resp 15   Ht 5\' 10"  (1.778 m)   Wt 234 lb 4 oz (106.3 kg)   SpO2 97%   BMI 33.61 kg/m   Physical Exam Vitals and nursing note reviewed.  Constitutional:       Appearance: Normal appearance. He is well-developed and well-groomed. He is not ill-appearing.  HENT:     Head: Normocephalic.     Right Ear: Hearing and external ear normal.     Left Ear: Hearing and external ear normal.     Nose: Nose normal. No nasal deformity or rhinorrhea.  Eyes:     General: Lids are normal. Lids are everted, no foreign bodies appreciated.     Extraocular Movements: Extraocular movements intact.     Conjunctiva/sclera: Conjunctivae normal.     Pupils: Pupils are equal, round, and reactive to light.  Neck:     Thyroid: No thyromegaly.     Vascular: No carotid bruit or JVD.  Cardiovascular:     Rate and Rhythm: Normal rate and regular rhythm.     Pulses: Normal pulses.     Heart sounds: Normal heart sounds, S1 normal and S2 normal.  Pulmonary:     Effort: Pulmonary effort is normal.     Breath sounds: Normal breath sounds.  Chest:     Chest wall: No deformity.  Abdominal:     General: Abdomen is flat. Bowel sounds are normal. There is no abdominal bruit.     Palpations: Abdomen is soft. There is no hepatomegaly or splenomegaly.     Tenderness: There is no abdominal tenderness.  Musculoskeletal:        General: Normal range of motion.     Cervical back: Full passive range of motion without pain, normal range of motion and neck supple.     Right lower leg: No edema.     Left lower leg: No edema.  Lymphadenopathy:     Cervical: No cervical adenopathy.  Skin:    General: Skin is warm and dry.     Capillary Refill: Capillary refill takes less than 2 seconds.  Neurological:     General: No focal deficit present.     Mental Status: He is alert and oriented to person, place, and time.  Psychiatric:        Attention and Perception: Attention normal.        Mood and Affect: Mood normal.        Speech: Speech normal.        Behavior: Behavior normal. Behavior is cooperative.        Thought Content: Thought content normal.        Cognition and Memory: Cognition  normal.  Judgment: Judgment normal.     Assessment & Plan:   Problem List Items Addressed This Visit      Cardiovascular and Mediastinum   Hypertension - Primary   Relevant Orders   CBC with Differential/Platelet   COMPLETE METABOLIC PANEL WITH GFR   COMPLETE METABOLIC PANEL WITH GFR   CBC with Differential/Platelet     Digestive   Fatty liver   Relevant Orders   COMPLETE METABOLIC PANEL WITH GFR   COMPLETE METABOLIC PANEL WITH GFR    Other Visit Diagnoses    Lipid screening       Relevant Orders   Lipid panel   Lipid panel    Follow low fat, low cholesterol diet.  Get plenty of exercise and drink plenty of water.  Take Blood pressure 3 hours after taking AM blood pressure medication bring readings to all appointments. Goal BP is <140/90. You have established care with labs today we will call you with results and directions if abnormal. Consider COVID vaccine  Outpatient Encounter Medications as of 12/11/2019  Medication Sig  . amLODipine (NORVASC) 10 MG tablet Take 1 tablet (10 mg total) by mouth daily.  . enalapril (VASOTEC) 10 MG tablet TAKE 2 TABLETS BY MOUTH  DAILY  . ibuprofen (ADVIL,MOTRIN) 200 MG tablet Take 200 mg by mouth every 6 (six) hours as needed.   No facility-administered encounter medications on file as of 12/11/2019.    Follow-up: Return in about 6 months (around 06/12/2020) for labs one week prior.   Elmore Guise, FNP

## 2019-12-11 NOTE — Patient Instructions (Signed)
Follow low fat, low cholesterol diet.  Get plenty of exercise and drink plenty of water.  Take Blood pressure 3 hours after taking AM blood pressure medication bring readings to all appointments. Goal BP is <140/90. You have established care with labs today we will call you with results and directions if abnormal.

## 2019-12-17 NOTE — Progress Notes (Signed)
Please ask lab to add A1C glucose elevated.

## 2019-12-18 LAB — COMPLETE METABOLIC PANEL WITH GFR
AG Ratio: 2 (calc) (ref 1.0–2.5)
ALT: 45 U/L (ref 9–46)
AST: 22 U/L (ref 10–40)
Albumin: 4.5 g/dL (ref 3.6–5.1)
Alkaline phosphatase (APISO): 95 U/L (ref 36–130)
BUN: 16 mg/dL (ref 7–25)
CO2: 26 mmol/L (ref 20–32)
Calcium: 9.3 mg/dL (ref 8.6–10.3)
Chloride: 109 mmol/L (ref 98–110)
Creat: 0.95 mg/dL (ref 0.60–1.35)
GFR, Est African American: 108 mL/min/{1.73_m2} (ref >=60)
GFR, Est Non African American: 94 mL/min/{1.73_m2} (ref >=60)
Globulin: 2.2 g/dL (calc) (ref 1.9–3.7)
Glucose, Bld: 120 mg/dL — ABNORMAL HIGH (ref 65–99)
Potassium: 4.3 mmol/L (ref 3.5–5.3)
Sodium: 142 mmol/L (ref 135–146)
Total Bilirubin: 0.5 mg/dL (ref 0.2–1.2)
Total Protein: 6.7 g/dL (ref 6.1–8.1)

## 2019-12-18 LAB — LIPID PANEL
Cholesterol: 178 mg/dL (ref <200)
HDL: 40 mg/dL (ref >=40)
LDL Cholesterol (Calc): 120 mg/dL (calc) — ABNORMAL HIGH
Non-HDL Cholesterol (Calc): 138 mg/dL (calc) — ABNORMAL HIGH (ref <130)
Total CHOL/HDL Ratio: 4.5 (calc) (ref <5.0)
Triglycerides: 84 mg/dL (ref <150)

## 2019-12-18 LAB — CBC WITH DIFFERENTIAL/PLATELET
Absolute Monocytes: 442 cells/uL (ref 200–950)
Basophils Absolute: 28 cells/uL (ref 0–200)
Basophils Relative: 0.4 %
Eosinophils Absolute: 242 cells/uL (ref 15–500)
Eosinophils Relative: 3.5 %
HCT: 46.5 % (ref 38.5–50.0)
Hemoglobin: 15.7 g/dL (ref 13.2–17.1)
Lymphs Abs: 1684 cells/uL (ref 850–3900)
MCH: 31.3 pg (ref 27.0–33.0)
MCHC: 33.8 g/dL (ref 32.0–36.0)
MCV: 92.8 fL (ref 80.0–100.0)
MPV: 10.7 fL (ref 7.5–12.5)
Monocytes Relative: 6.4 %
Neutro Abs: 4506 cells/uL (ref 1500–7800)
Neutrophils Relative %: 65.3 %
Platelets: 218 10*3/uL (ref 140–400)
RBC: 5.01 10*6/uL (ref 4.20–5.80)
RDW: 12.6 % (ref 11.0–15.0)
Total Lymphocyte: 24.4 %
WBC: 6.9 10*3/uL (ref 3.8–10.8)

## 2019-12-18 LAB — HEMOGLOBIN A1C W/OUT EAG: Hgb A1c MFr Bld: 5.2 % of total Hgb (ref <5.7)

## 2019-12-18 LAB — TEST AUTHORIZATION

## 2019-12-25 ENCOUNTER — Ambulatory Visit: Payer: 59 | Admitting: Family Medicine

## 2020-03-17 ENCOUNTER — Other Ambulatory Visit: Payer: Self-pay | Admitting: Family Medicine

## 2020-04-03 ENCOUNTER — Encounter (INDEPENDENT_AMBULATORY_CARE_PROVIDER_SITE_OTHER): Payer: Self-pay | Admitting: *Deleted

## 2020-04-08 ENCOUNTER — Encounter: Payer: Self-pay | Admitting: Nurse Practitioner

## 2020-04-08 ENCOUNTER — Other Ambulatory Visit: Payer: Self-pay | Admitting: Nurse Practitioner

## 2020-05-27 ENCOUNTER — Telehealth: Payer: Self-pay | Admitting: Family Medicine

## 2020-06-13 ENCOUNTER — Other Ambulatory Visit: Payer: 59

## 2020-06-13 ENCOUNTER — Other Ambulatory Visit: Payer: Self-pay

## 2020-06-13 DIAGNOSIS — K760 Fatty (change of) liver, not elsewhere classified: Secondary | ICD-10-CM

## 2020-06-13 DIAGNOSIS — Z13220 Encounter for screening for lipoid disorders: Secondary | ICD-10-CM

## 2020-06-13 DIAGNOSIS — I10 Essential (primary) hypertension: Secondary | ICD-10-CM

## 2020-06-14 LAB — CBC WITH DIFFERENTIAL/PLATELET
Absolute Monocytes: 537 cells/uL (ref 200–950)
Basophils Absolute: 20 cells/uL (ref 0–200)
Basophils Relative: 0.3 %
Eosinophils Absolute: 313 cells/uL (ref 15–500)
Eosinophils Relative: 4.6 %
HCT: 45.1 % (ref 38.5–50.0)
Hemoglobin: 15.3 g/dL (ref 13.2–17.1)
Lymphs Abs: 1693 cells/uL (ref 850–3900)
MCH: 31.5 pg (ref 27.0–33.0)
MCHC: 33.9 g/dL (ref 32.0–36.0)
MCV: 92.8 fL (ref 80.0–100.0)
MPV: 10.2 fL (ref 7.5–12.5)
Monocytes Relative: 7.9 %
Neutro Abs: 4236 cells/uL (ref 1500–7800)
Neutrophils Relative %: 62.3 %
Platelets: 205 10*3/uL (ref 140–400)
RBC: 4.86 10*6/uL (ref 4.20–5.80)
RDW: 12.5 % (ref 11.0–15.0)
Total Lymphocyte: 24.9 %
WBC: 6.8 10*3/uL (ref 3.8–10.8)

## 2020-06-14 LAB — COMPLETE METABOLIC PANEL WITH GFR
AG Ratio: 1.8 (calc) (ref 1.0–2.5)
ALT: 68 U/L — ABNORMAL HIGH (ref 9–46)
AST: 35 U/L (ref 10–40)
Albumin: 4.3 g/dL (ref 3.6–5.1)
Alkaline phosphatase (APISO): 91 U/L (ref 36–130)
BUN: 14 mg/dL (ref 7–25)
CO2: 26 mmol/L (ref 20–32)
Calcium: 9.3 mg/dL (ref 8.6–10.3)
Chloride: 108 mmol/L (ref 98–110)
Creat: 0.94 mg/dL (ref 0.60–1.35)
GFR, Est African American: 110 mL/min/{1.73_m2} (ref >=60)
GFR, Est Non African American: 95 mL/min/{1.73_m2} (ref >=60)
Globulin: 2.4 g/dL (calc) (ref 1.9–3.7)
Glucose, Bld: 108 mg/dL — ABNORMAL HIGH (ref 65–99)
Potassium: 4.3 mmol/L (ref 3.5–5.3)
Sodium: 141 mmol/L (ref 135–146)
Total Bilirubin: 0.7 mg/dL (ref 0.2–1.2)
Total Protein: 6.7 g/dL (ref 6.1–8.1)

## 2020-06-14 LAB — LIPID PANEL
Cholesterol: 182 mg/dL (ref <200)
HDL: 42 mg/dL (ref >=40)
LDL Cholesterol (Calc): 125 mg/dL (calc) — ABNORMAL HIGH
Non-HDL Cholesterol (Calc): 140 mg/dL (calc) — ABNORMAL HIGH (ref <130)
Total CHOL/HDL Ratio: 4.3 (calc) (ref <5.0)
Triglycerides: 60 mg/dL (ref <150)

## 2020-06-17 ENCOUNTER — Ambulatory Visit (INDEPENDENT_AMBULATORY_CARE_PROVIDER_SITE_OTHER): Payer: 59 | Admitting: Family Medicine

## 2020-06-17 ENCOUNTER — Other Ambulatory Visit: Payer: Self-pay

## 2020-06-17 ENCOUNTER — Ambulatory Visit: Payer: 59 | Admitting: Nurse Practitioner

## 2020-06-17 VITALS — BP 110/80 | HR 64 | Temp 98.0°F | Ht 70.0 in

## 2020-06-17 DIAGNOSIS — R739 Hyperglycemia, unspecified: Secondary | ICD-10-CM | POA: Diagnosis not present

## 2020-06-17 DIAGNOSIS — K760 Fatty (change of) liver, not elsewhere classified: Secondary | ICD-10-CM | POA: Diagnosis not present

## 2020-06-17 DIAGNOSIS — I10 Essential (primary) hypertension: Secondary | ICD-10-CM | POA: Diagnosis not present

## 2020-06-17 NOTE — Progress Notes (Signed)
Subjective:    Patient ID: Christopher Lawson, male    DOB: [birth year], 49 y.o.   MRN: [id number]  HPI Patient presents today for follow-up of his hypertension.  Blood pressure is 110/80.  He denies any chest pain shortness of breath or dyspnea on exertion.  He does have a history of fatty liver disease.  His most recent lab work is listed below.  Based on his age, his blood pressure, and his cholesterol, I calculate his 10-year risk of cardiovascular disease to be 5.6%. Appointment on 06/13/2020  Component Date Value Ref Range Status  . Cholesterol 06/13/2020 182  <200 mg/dL Final  . HDL 40/98/1191 42  > OR = 40 mg/dL Final  . Triglycerides 06/13/2020 60  <150 mg/dL Final  . LDL Cholesterol (Calc) 06/13/2020 125* mg/dL (calc) Final   Comment: Reference range: <100 . Desirable range <100 mg/dL for primary prevention;   <70 mg/dL for patients with CHD or diabetic patients  with > or = 2 CHD risk factors. Marland Kitchen LDL-C is now calculated using the Martin-Hopkins  calculation, which is a validated novel method providing  better accuracy than the Friedewald equation in the  estimation of LDL-C.  Horald Pollen et al. Lenox Ahr. 4782;956(21): 2061-2068  (http://education.[facility].com/faq/FAQ164)   . Total CHOL/HDL Ratio 06/13/2020 4.3  <3.0 (calc) Final  . Non-HDL Cholesterol (Calc) 06/13/2020 140* <130 mg/dL (calc) Final   Comment: For patients with diabetes plus 1 major ASCVD risk  factor, treating to a non-HDL-C goal of <100 mg/dL  (LDL-C of <86 mg/dL) is considered a therapeutic  option.   . WBC 06/13/2020 6.8  3.8 - 10.8 Thousand/uL Final  . RBC 06/13/2020 4.86  4.20 - 5.80 Million/uL Final  . Hemoglobin 06/13/2020 15.3  13.2 - 17.1 g/dL Final  . HCT 57/84/6962 45.1  38 - 50 % Final  . MCV 06/13/2020 92.8  80.0 - 100.0 fL Final  . MCH 06/13/2020 31.5  27.0 - 33.0 pg Final  . MCHC 06/13/2020 33.9  32.0 - 36.0 g/dL Final  . RDW 95/28/4132 12.5  11.0 - 15.0 % Final  . Platelets 06/13/2020  205  140 - 400 Thousand/uL Final  . MPV 06/13/2020 10.2  7.5 - 12.5 fL Final  . Neutro Abs 06/13/2020 4,236  1,500 - 7,800 cells/uL Final  . Lymphs Abs 06/13/2020 1,693  850 - 3,900 cells/uL Final  . Absolute Monocytes 06/13/2020 537  200 - 950 cells/uL Final  . Eosinophils Absolute 06/13/2020 313  15.0 - 500.0 cells/uL Final  . Basophils Absolute 06/13/2020 20  0.0 - 200.0 cells/uL Final  . Neutrophils Relative % 06/13/2020 62.3  % Final  . Total Lymphocyte 06/13/2020 24.9  % Final  . Monocytes Relative 06/13/2020 7.9  % Final  . Eosinophils Relative 06/13/2020 4.6  % Final  . Basophils Relative 06/13/2020 0.3  % Final  . Glucose, Bld 06/13/2020 108* 65 - 99 mg/dL Final   Comment: .            Fasting reference interval . For someone without known diabetes, a glucose value between 100 and 125 mg/dL is consistent with prediabetes and should be confirmed with a follow-up test. .   . BUN 06/13/2020 14  7 - 25 mg/dL Final  . Creat 44/08/270 0.94  0.60 - 1.35 mg/dL Final  . GFR, Est Non African American 06/13/2020 95  > OR = 60 mL/min/1.46m2 Final  . GFR, Est African American 06/13/2020 110  > OR = 60 mL/min/1.69m2 Final  .  BUN/Creatinine Ratio 06/13/2020 NOT APPLICABLE  6 - 22 (calc) Final  . Sodium 06/13/2020 141  135 - 146 mmol/L Final  . Potassium 06/13/2020 4.3  3.5 - 5.3 mmol/L Final  . Chloride 06/13/2020 108  98 - 110 mmol/L Final  . CO2 06/13/2020 26  20 - 32 mmol/L Final  . Calcium 06/13/2020 9.3  8.6 - 10.3 mg/dL Final  . Total Protein 06/13/2020 6.7  6.1 - 8.1 g/dL Final  . Albumin 40/98/1191 4.3  3.6 - 5.1 g/dL Final  . Globulin 47/82/9562 2.4  1.9 - 3.7 g/dL (calc) Final  . AG Ratio 06/13/2020 1.8  1.0 - 2.5 (calc) Final  . Total Bilirubin 06/13/2020 0.7  0.2 - 1.2 mg/dL Final  . Alkaline phosphatase (APISO) 06/13/2020 91  36 - 130 U/L Final  . AST 06/13/2020 35  10 - 40 U/L Final  . ALT 06/13/2020 68* 9 - 46 U/L Final   Past Medical History:  Diagnosis Date  .  Abrasion of right index finger 11/20/2012  . Arthritis    left shoulder - to have surgery 12/12/2012  . Cellulitis of left lower limb   . Dental crown present   . Deviated nasal septum 11/2012  . Essential (primary) hypertension   . 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 Fatty (change of) liver, not elsewhere classified   . Fatty liver   . Hypertension    under control with meds., has been on med. x 5 yr.  . 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  . Obesity, unspecified   . Pain in left knee   . Pain in right foot   . Pain in right shoulder   . Sebaceous cyst   . Spontaneous rupture of other tendons, right upper arm   . Tinnitus, right ear   . Unilateral primary osteoarthritis, left knee    Past Surgical History:  Procedure Laterality Date  . bicep tendon surgery    . CYST EXCISION     wrist and back (same surgery)  . INGUINAL HERNIA REPAIR Right   . left shoulder surgery    . NASAL SEPTOPLASTY W/ TURBINOPLASTY Bilateral 11/27/2012   Procedure: NASAL SEPTOPLASTY WITH BILATERAL TURBINATE RESECTION;  Surgeon: Darletta Moll, MD;  Location: [facility];  Service: ENT;  Laterality: Bilateral;  . WISDOM TOOTH EXTRACTION     Current Outpatient Medications on File Prior to Visit  Medication Sig Dispense Refill  . amLODipine (NORVASC) 10 MG tablet TAKE (1) TABLET BY MOUTH ONCE DAILY. 90 tablet 1  . enalapril (VASOTEC) 10 MG tablet TAKE 2 TABLETS BY MOUTH  DAILY 180 tablet 1  . ibuprofen (ADVIL,MOTRIN) 200 MG tablet Take 200 mg by mouth every 6 (six) hours as needed.     No current facility-administered medications on file prior to visit.   Allergies  Allergen Reactions  . Other    Social History   Socioeconomic History  . Marital status: Married    Spouse name: Not on file  . Number of children: Not on file  . Years of education: Not on file  . Highest education level: Not on file  Occupational History  . Not on file  Tobacco Use  .  Smoking status: Never Smoker  . Smokeless tobacco: Never Used  Substance and Sexual Activity  . Alcohol use: Yes    Comment: occasionally  . Drug use: No  . Sexual activity: Not on file  Other Topics  Concern  . Not on file  Social History Narrative  . Not on file   Social Determinants of Health   Financial Resource Strain:   . Difficulty of Paying Living Expenses: Not on file  Food Insecurity:   . Worried About Programme researcher, broadcasting/film/video in the Last Year: Not on file  . Ran Out of Food in the Last Year: Not on file  Transportation Needs:   . Lack of Transportation (Medical): Not on file  . Lack of Transportation (Non-Medical): Not on file  Physical Activity:   . Days of Exercise per Week: Not on file  . Minutes of Exercise per Session: Not on file  Stress:   . Feeling of Stress : Not on file  Social Connections:   . Frequency of Communication with Friends and Family: Not on file  . Frequency of Social Gatherings with Friends and Family: Not on file  . Attends Religious Services: Not on file  . Active Member of Clubs or Organizations: Not on file  . Attends Banker Meetings: Not on file  . Marital Status: Not on file  Intimate Partner Violence:   . Fear of Current or Ex-Partner: Not on file  . Emotionally Abused: Not on file  . Physically Abused: Not on file  . Sexually Abused: Not on file      Review of Systems  All other systems reviewed and are negative.      Objective:   Physical Exam Vitals reviewed.  Constitutional:      Appearance: Normal appearance. He is normal weight.  Cardiovascular:     Rate and Rhythm: Normal rate and regular rhythm.     Heart sounds: Normal heart sounds. No murmur heard.  No friction rub. No gallop.   Pulmonary:     Effort: Pulmonary effort is normal. No respiratory distress.     Breath sounds: Normal breath sounds. No wheezing, rhonchi or rales.  Neurological:     Mental Status: He is alert.           Assessment  & Plan:  Primary hypertension  Fatty liver  Elevated blood sugar  Blood pressures well controlled today.  Lab work shows a mild elevation in liver function test consistent with his fatty liver disease.  He also has mild elevations in his cholesterol and also a mild elevation in his fasting blood sugar to 108.  I believe he has underlying metabolic syndrome.  I recommended 30 minutes a day of aerobic exercise 5 days a week.  I recommended 10 to 15 pounds of weight loss.  I recommended eating a low carbohydrate diet rich in fruits and vegetables.  I recommended avoiding fatty foods and trying to limit his intake of pork, red meat, bread, potatoes, and starchy food.  Also recommended that he stop drinking sodas.  Recheck in 6 months

## 2020-06-30 NOTE — Telephone Encounter (Signed)
Message sent in error

## 2020-06-30 NOTE — Telephone Encounter (Signed)
error 

## 2020-07-10 IMAGING — MR MR ABDOMEN WO/W CM MRCP
8 of 19 series · 15 of 48 positions shown · IV contrast (gadavist)
Comparison: 05/03/2018 abdominal ultrasound.

CLINICAL DATA: Elevated liver function tests. Evaluate for
cirrhosis or steatosis. Gallbladder sludge. Patient without current
symptoms.

EXAM:
MRI ABDOMEN WITHOUT AND WITH CONTRAST (INCLUDING MRCP)
TECHNIQUE: Multiplanar multisequence MR imaging of the abdomen was performed
both before and after the administration of intravenous contrast.
Heavily T2-weighted images of the biliary and pancreatic ducts were
obtained, and three-dimensional MRCP images were rendered by post
processing.
CONTRAST:  10mL GADAVIST GADOBUTROL 1 MMOL/ML IV SOLN

[Series 3: T2 · axial · 4.0mm · 1.16mm/px · 1 of 48 slices shown (1 of 2)]
[im 1/48]
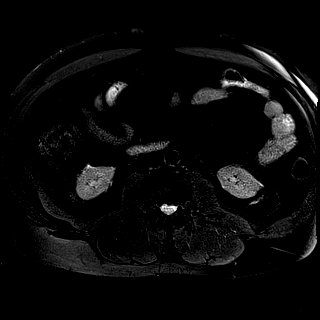

[Series 5: GRE · coronal · 5.0mm · 1.28mm/px · 1 of 36 slices shown]
[im 1/36]
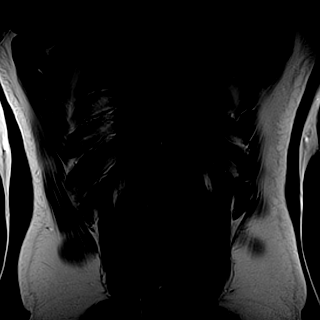

[Series 6: ax dual echo_in · axial · 4.0mm · 0.61mm/px · z∈[-117,+135]mm · 2 of 64 slices shown]
[im 1/64]
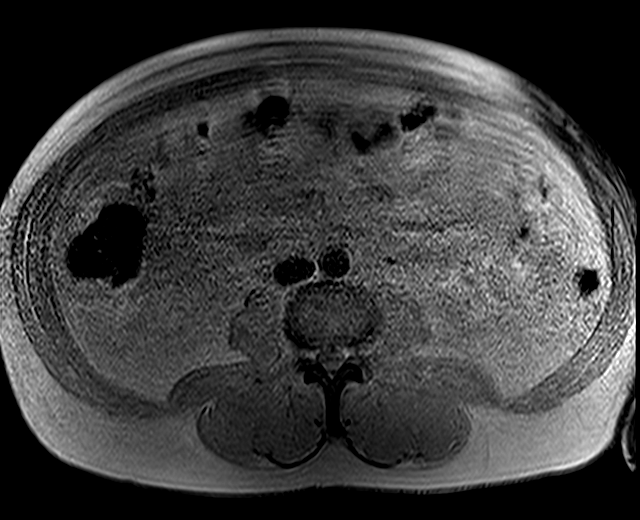
[im 64/64]
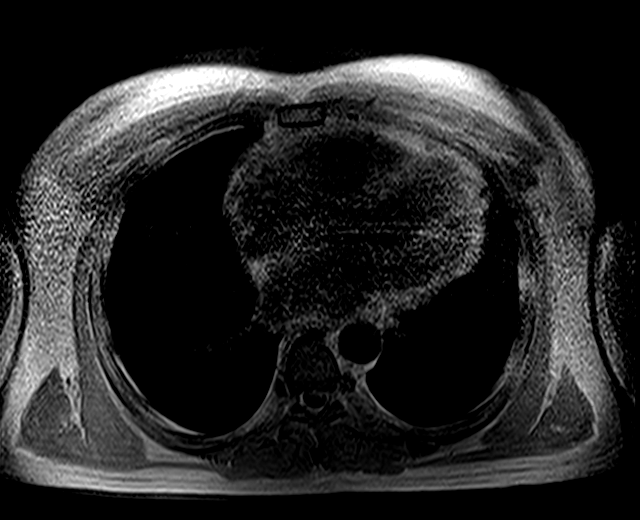

[Series 7: ax dual echo_opp · axial · 4.0mm · 0.61mm/px · z∈[-117,+135]mm · 2 of 64 slices shown]
[im 1/64]
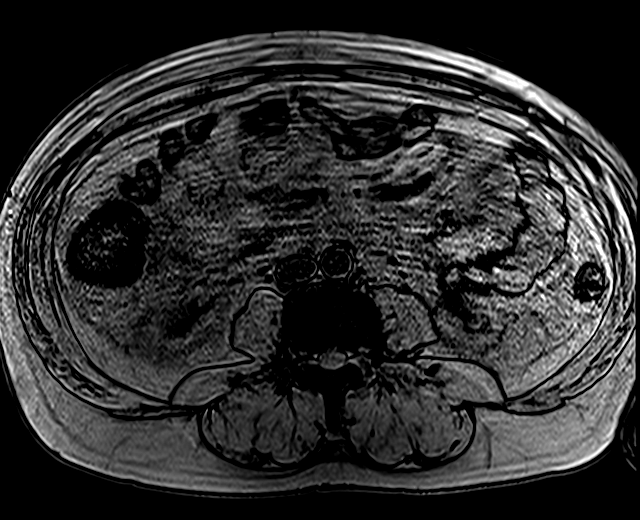
[im 64/64]
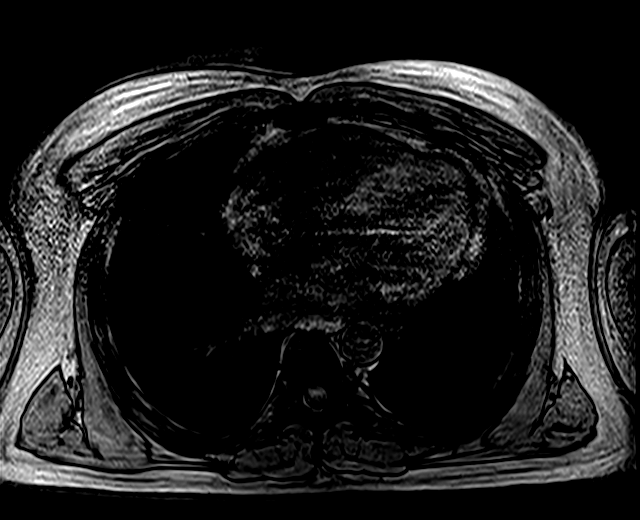

[Series 10: cor thins · coronal · 4.0mm · 0.69mm/px · 1 of 20 slices shown]
[im 1/20]
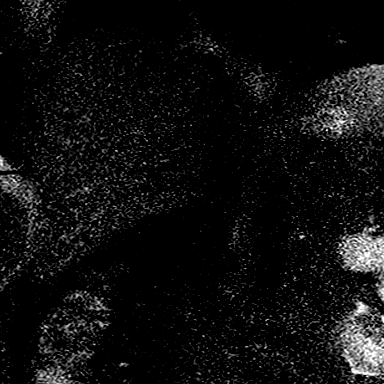

[Series 15: DWI · axial · 5.0mm · 1.04mm/px · z∈[-97,+137]mm · 4 of 80 slices shown]
[im 1/80]
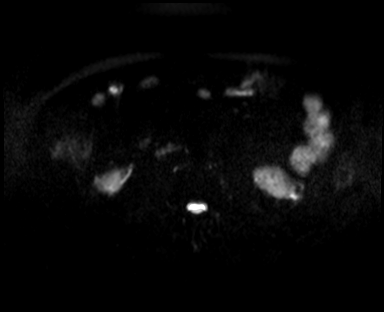
[im 27/80]
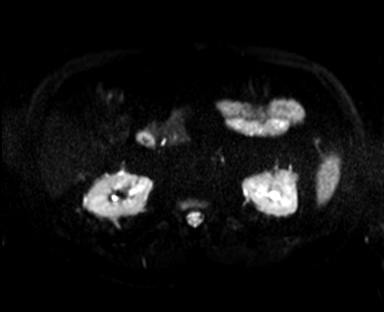
[im 53/80]
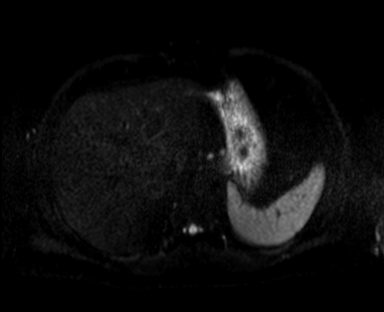
[im 80/80]
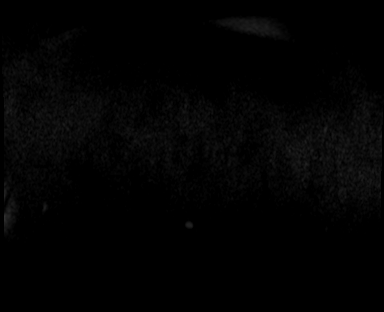

[Series 16: ax dwi_adc · axial · 5.0mm · 1.04mm/px · z∈[-97,+137]mm · 2 of 40 slices shown]
[im 1/40]
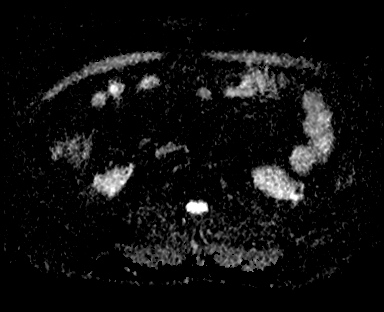
[im 40/40]
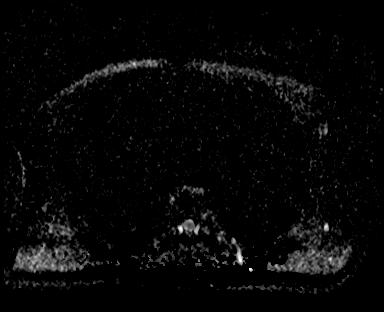

[Series 25: T2 · axial · 5.0mm · 1.56mm/px · z∈[-108,+126]mm · 2 of 40 slices shown (2 of 2)]
[im 1/40]
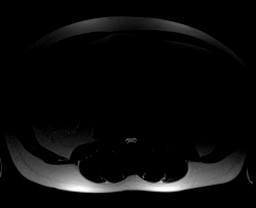
[im 40/40]
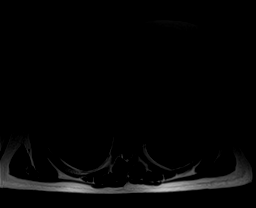

[15 of 48 positions shown; findings below may reference images not displayed]

FINDINGS: Portions of exam are mildly motion degraded.

Lower chest: Borderline cardiomegaly, without pericardial or pleural
effusion.

Hepatobiliary: Mild hepatic steatosis and borderline hepatomegaly at
17.6 cm craniocaudal. Mild caudate lobe prominence, without specific
evidence of cirrhosis. Small gallstones without acute cholecystitis
or biliary duct dilatation. No choledocholithiasis.

Pancreas:  Normal, without mass or ductal dilatation.

Spleen: A T2 hyperintense anterior splenic lesion including at
cm on [DATE] demonstrates delayed post-contrast enhancement, favoring
a hemangioma.

Adrenals/Urinary Tract: Normal adrenal glands. Normal kidneys,
without hydronephrosis.

Stomach/Bowel: Normal stomach and abdominal bowel loops.

Vascular/Lymphatic: Normal caliber of the aorta and branch vessels.
No retroperitoneal or retrocrural adenopathy.

Other:  No ascites.

Musculoskeletal: No acute osseous abnormality.
IMPRESSION: 1. Mild hepatic steatosis.
2. Cholelithiasis.
3. Mild motion degradation.

## 2020-10-03 ENCOUNTER — Encounter: Payer: Self-pay | Admitting: Family Medicine

## 2020-10-03 ENCOUNTER — Ambulatory Visit: Payer: 59 | Admitting: Family Medicine

## 2020-10-03 ENCOUNTER — Other Ambulatory Visit: Payer: Self-pay

## 2020-10-03 VITALS — BP 138/74 | HR 65 | Temp 98.3°F | Ht 70.0 in | Wt 236.0 lb

## 2020-10-03 DIAGNOSIS — M542 Cervicalgia: Secondary | ICD-10-CM | POA: Diagnosis not present

## 2020-10-03 MED ORDER — METHOCARBAMOL 500 MG PO TABS: 500.0000 mg | ORAL_TABLET | Freq: Four times a day (QID) | ORAL | 0 refills | Status: DC | PRN

## 2020-10-03 NOTE — Progress Notes (Signed)
Subjective:    Patient ID: Christopher Lawson, male    DOB: 1970/10/31, 50 y.o.   MRN: [id number]  HPI Patient presents today with right-sided neck pain.  The pain has been present off and on for 2 weeks.  The pain starts just below his right ear and radiates down his neck to his clavicle and into his sternum.  There is no movement that triggers the pain.  There is no position that triggers the pain.  Sometimes the pain lasts for a few hours.  Sometimes is brief and only lasts a second.  He denies any trouble swallowing.  There is no palpable mass in the neck.  He denies any stridor or dysphagia.  He denies any hemoptysis or hematemesis.  He denies any fevers or chills.  There is no pain in his neck with turning his head side to side or touching his chin to his chest.  He denies any cervical radiculopathy or arm weakness or arm numbness.  His exam today is normal Past Medical History:  Diagnosis Date  . Abrasion of right index finger 11/20/2012  . Arthritis    left shoulder - to have surgery 12/12/2012  . Cellulitis of left lower limb   . Dental crown present   . Deviated nasal septum 11/2012  . Essential (primary) hypertension   . 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 Fatty (change of) liver, not elsewhere classified   . Fatty liver   . Hypertension    under control with meds., has been on med. x 5 yr.  . 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  . Obesity, unspecified   . Pain in left knee   . Pain in right foot   . Pain in right shoulder   . Sebaceous cyst   . Spontaneous rupture of other tendons, right upper arm   . Tinnitus, right ear   . Unilateral primary osteoarthritis, left knee    Past Surgical History:  Procedure Laterality Date  . bicep tendon surgery    . CYST EXCISION     wrist and back (same surgery)  . INGUINAL HERNIA REPAIR Right   . left shoulder surgery    . NASAL SEPTOPLASTY W/ TURBINOPLASTY Bilateral 11/27/2012   Procedure: NASAL  SEPTOPLASTY WITH BILATERAL TURBINATE RESECTION;  Surgeon: Darletta Moll, MD;  Location: [facility];  Service: ENT;  Laterality: Bilateral;  . WISDOM TOOTH EXTRACTION     Current Outpatient Medications on File Prior to Visit  Medication Sig Dispense Refill  . amLODipine (NORVASC) 10 MG tablet TAKE (1) TABLET BY MOUTH ONCE DAILY. 90 tablet 1  . enalapril (VASOTEC) 10 MG tablet TAKE 2 TABLETS BY MOUTH  DAILY 180 tablet 1  . ibuprofen (ADVIL,MOTRIN) 200 MG tablet Take 200 mg by mouth every 6 (six) hours as needed.     No current facility-administered medications on file prior to visit.   Allergies  Allergen Reactions  . Other    Social History   Socioeconomic History  . Marital status: Married    Spouse name: Not on file  . Number of children: Not on file  . Years of education: Not on file  . Highest education level: Not on file  Occupational History  . Not on file  Tobacco Use  . Smoking status: Never Smoker  . Smokeless tobacco: Never Used  Substance and Sexual Activity  . Alcohol use: Yes    Comment: occasionally  .  Drug use: No  . Sexual activity: Not on file  Other Topics Concern  . Not on file  Social History Narrative  . Not on file   Social Determinants of Health   Financial Resource Strain: Not on file  Food Insecurity: Not on file  Transportation Needs: Not on file  Physical Activity: Not on file  Stress: Not on file  Social Connections: Not on file  Intimate Partner Violence: Not on file      Review of Systems  Musculoskeletal: Positive for neck pain.  All other systems reviewed and are negative.      Objective:   Physical Exam Vitals reviewed.  Constitutional:      Appearance: Normal appearance. He is normal weight.  Neck:     Thyroid: No thyroid mass or thyromegaly.     Vascular: No carotid bruit.   Cardiovascular:     Rate and Rhythm: Normal rate and regular rhythm.     Heart sounds: Normal heart sounds. No murmur heard. No  friction rub. No gallop.   Pulmonary:     Effort: Pulmonary effort is normal. No respiratory distress.     Breath sounds: Normal breath sounds. No wheezing, rhonchi or rales.  Musculoskeletal:     Cervical back: Full passive range of motion without pain and normal range of motion. No edema, erythema, rigidity, torticollis or crepitus. No pain with movement, spinous process tenderness or muscular tenderness. Normal range of motion.  Lymphadenopathy:     Cervical:     Right cervical: No superficial, deep or posterior cervical adenopathy.    Left cervical: No superficial, deep or posterior cervical adenopathy.  Neurological:     Mental Status: He is alert.           Assessment & Plan:  Neck pain  The diagram above shows the location and distribution of the pain.  This seems to be following the sternocleidomastoid muscle.  The patient states the pain began shortly after his wife changed their mattress.  Because of snoring she has been sleeping at an odd angle which could be causing a crick in his neck.  However I am unable to elicit the pain today and neither is the patient.  I believe he may have muscle spasms in the sternocleidomastoid muscle due to positioning at night.  We will try Robaxin 500 mg every 8 hours as needed.  If not improving, the next step would be to obtain imaging of the neck as the history and physical exam do not point to a specific cause.

## 2020-10-06 ENCOUNTER — Ambulatory Visit: Payer: 59 | Admitting: Family Medicine

## 2020-10-15 ENCOUNTER — Telehealth: Payer: Self-pay

## 2020-10-15 MED ORDER — AMLODIPINE BESYLATE 10 MG PO TABS: ORAL_TABLET | ORAL | 1 refills | Status: DC

## 2020-10-15 NOTE — Addendum Note (Signed)
Addended by: Phillips Odor on: 10/15/2020 05:29 PM   Modules accepted: Orders

## 2020-10-15 NOTE — Telephone Encounter (Signed)
Prescription sent to pharmacy.

## 2020-10-15 NOTE — Telephone Encounter (Signed)
Pharmacy faxed in med refill amLODipine (NORVASC) 10 MG tablet    [facility] - [place], [place] - [address]  [address], [place] [address]

## 2020-10-20 ENCOUNTER — Encounter: Payer: Self-pay | Admitting: Family Medicine

## 2020-10-20 DIAGNOSIS — M5030 Other cervical disc degeneration, unspecified cervical region: Secondary | ICD-10-CM

## 2020-10-21 ENCOUNTER — Ambulatory Visit
Admission: RE | Admit: 2020-10-21 | Discharge: 2020-10-21 | Disposition: A | Discharge status: Home / Self Care | Payer: 59 | Source: Ambulatory Visit | Attending: Family Medicine | Admitting: Family Medicine

## 2020-10-21 ENCOUNTER — Other Ambulatory Visit: Payer: Self-pay | Admitting: Family Medicine

## 2020-10-21 DIAGNOSIS — M542 Cervicalgia: Secondary | ICD-10-CM

## 2020-11-04 ENCOUNTER — Other Ambulatory Visit: Payer: Self-pay

## 2020-11-04 ENCOUNTER — Ambulatory Visit: Payer: 59 | Admitting: Orthopaedic Surgery

## 2020-11-04 ENCOUNTER — Encounter: Payer: Self-pay | Admitting: Orthopaedic Surgery

## 2020-11-04 VITALS — BP 155/91 | HR 73 | Ht 71.0 in | Wt 236.2 lb

## 2020-11-04 DIAGNOSIS — M542 Cervicalgia: Secondary | ICD-10-CM

## 2020-11-04 MED ORDER — NAPROXEN 500 MG PO TABS: 500.0000 mg | ORAL_TABLET | Freq: Two times a day (BID) | ORAL | 5 refills | Status: DC

## 2020-11-04 NOTE — Progress Notes (Signed)
Subjective:    Patient ID: Christopher Lawson, male    DOB: [birth year], 50 y.o.   MRN: [id number]  HPI He has neck pain of the right side of the neck and sternocleidomastoid muscle on the right.  It has been present several months.  He has no trauma.  He has no weakness and no numbness.  He is a little better but it still hurts.  He has been seen by [facility] and I have reviewed the notes and X-rays.  I have independently reviewed and interpreted x-rays of this patient done at another site by another physician or qualified health professional.  He has been on Robaxin.  He has relief with this but does not take it often.  He says he does not like to take medicine. He takes an occasional ibuprofen.  He is concerned his neck still hurts.   Review of Systems  Constitutional: Positive for activity change.  Musculoskeletal: Positive for arthralgias and neck pain.  All other systems reviewed and are negative.  For Review of Systems, all other systems reviewed and are negative.  The following is a summary of the past history medically, past history surgically, known current medicines, social history and family history.  This information is gathered electronically by the computer from prior information and documentation.  I review this each visit and have found including this information at this point in the chart is beneficial and informative.   Past Medical History:  Diagnosis Date  . Abrasion of right index finger 11/20/2012  . Arthritis    left shoulder - to have surgery 12/12/2012  . Cellulitis of left lower limb   . Dental crown present   . Deviated nasal septum 11/2012  . Essential (primary) hypertension   . 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 Fatty (change of) liver, not elsewhere classified   . Fatty liver   . Hypertension    under control with meds., has been on med. x 5 yr.  . 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  . Obesity,  unspecified   . Pain in left knee   . Pain in right foot   . Pain in right shoulder   . Sebaceous cyst   . Spontaneous rupture of other tendons, right upper arm   . Tinnitus, right ear   . Unilateral primary osteoarthritis, left knee     Past Surgical History:  Procedure Laterality Date  . bicep tendon surgery    . CYST EXCISION     wrist and back (same surgery)  . INGUINAL HERNIA REPAIR Right   . left shoulder surgery    . NASAL SEPTOPLASTY W/ TURBINOPLASTY Bilateral 11/27/2012   Procedure: NASAL SEPTOPLASTY WITH BILATERAL TURBINATE RESECTION;  Surgeon: Darletta Moll, MD;  Location: [facility];  Service: ENT;  Laterality: Bilateral;  . WISDOM TOOTH EXTRACTION      Current Outpatient Medications on File Prior to Visit  Medication Sig Dispense Refill  . amLODipine (NORVASC) 10 MG tablet TAKE (1) TABLET BY MOUTH ONCE DAILY. 90 tablet 1  . enalapril (VASOTEC) 10 MG tablet TAKE 2 TABLETS BY MOUTH  DAILY 180 tablet 1  . ibuprofen (ADVIL,MOTRIN) 200 MG tablet Take 200 mg by mouth every 6 (six) hours as needed.    . methocarbamol (ROBAXIN) 500 MG tablet Take 1 tablet (500 mg total) by mouth every 6 (six) hours as needed for muscle spasms. 30 tablet 0  No current facility-administered medications on file prior to visit.    Social History   Socioeconomic History  . Marital status: Married    Spouse name: Not on file  . Number of children: Not on file  . Years of education: Not on file  . Highest education level: Not on file  Occupational History  . Not on file  Tobacco Use  . Smoking status: Never Smoker  . Smokeless tobacco: Never Used  Substance and Sexual Activity  . Alcohol use: Yes    Comment: occasionally  . Drug use: No  . Sexual activity: Not on file  Other Topics Concern  . Not on file  Social History Narrative  . Not on file   Social Determinants of Health   Financial Resource Strain: Not on file  Food Insecurity: Not on file  Transportation  Needs: Not on file  Physical Activity: Not on file  Stress: Not on file  Social Connections: Not on file  Intimate Partner Violence: Not on file    History reviewed. No pertinent family history.  BP (!) 155/91   Pulse 73   Ht 5\' 11"  (1.803 m)   Wt 236 lb 4 oz (107.2 kg)   BMI 32.95 kg/m   Body mass index is 32.95 kg/m.      Objective:   Physical Exam Vitals and nursing note reviewed. Exam conducted with a chaperone present.  Constitutional:      Appearance: He is well-developed.  HENT:     Head: Normocephalic and atraumatic.  Eyes:     Conjunctiva/sclera: Conjunctivae normal.     Pupils: Pupils are equal, round, and reactive to light.  Cardiovascular:     Rate and Rhythm: Normal rate and regular rhythm.  Pulmonary:     Effort: Pulmonary effort is normal.  Abdominal:     Palpations: Abdomen is soft.  Musculoskeletal:       Arms:     Cervical back: Normal range of motion and neck supple.  Skin:    General: Skin is warm and dry.  Neurological:     Mental Status: He is alert and oriented to person, place, and time.     Cranial Nerves: No cranial nerve deficit.     Motor: No abnormal muscle tone.     Coordination: Coordination normal.     Deep Tendon Reflexes: Reflexes are normal and symmetric. Reflexes normal.  Psychiatric:        Behavior: Behavior normal.        Thought Content: Thought content normal.        Judgment: Judgment normal.           Assessment & Plan:   Encounter Diagnosis  Name Primary?  . Cervical pain Yes   I will begin naprosyn 500 po bid pc.  Return in two weeks.  Call if worse.  Electronically Signed , MD 3/29/20224:39 PM

## 2020-11-18 ENCOUNTER — Other Ambulatory Visit: Payer: Self-pay

## 2020-11-18 ENCOUNTER — Encounter: Payer: Self-pay | Admitting: Orthopaedic Surgery

## 2020-11-18 ENCOUNTER — Ambulatory Visit: Payer: 59 | Admitting: Orthopaedic Surgery

## 2020-11-18 VITALS — BP 134/87 | HR 77 | Temp 98.8°F | Resp 18 | Wt 236.6 lb

## 2020-11-18 DIAGNOSIS — M542 Cervicalgia: Secondary | ICD-10-CM | POA: Diagnosis not present

## 2020-11-18 NOTE — Progress Notes (Signed)
I am much better  His neck pain is much improved.  He missed one dose of the Naprosyn and had some pain that day but otherwise has done well.  He has no weakness or pain.  He is doing well.  Encounter Diagnosis  Name Primary?  . Cervical pain Yes   Continue the Naprosyn.  Return in three weeks.  If doing well, call and cancel next visit and stop the naprosyn.  Call if any problem.  Precautions discussed.   Electronically Signed Darreld Mclean, MD 4/12/20223:14 PM

## 2020-11-20 ENCOUNTER — Encounter: Payer: Self-pay | Admitting: Family Medicine

## 2020-12-09 ENCOUNTER — Ambulatory Visit: Payer: 59 | Admitting: Orthopaedic Surgery

## 2020-12-22 ENCOUNTER — Other Ambulatory Visit: Payer: Self-pay

## 2020-12-22 ENCOUNTER — Ambulatory Visit: Payer: 59 | Admitting: Family Medicine

## 2020-12-22 ENCOUNTER — Encounter: Payer: Self-pay | Admitting: Family Medicine

## 2020-12-22 VITALS — BP 128/82 | HR 82 | Temp 98.5°F | Resp 14 | Ht 71.0 in | Wt 231.0 lb

## 2020-12-22 DIAGNOSIS — G933 Postviral fatigue syndrome: Secondary | ICD-10-CM | POA: Diagnosis not present

## 2020-12-22 DIAGNOSIS — G9331 Postviral fatigue syndrome: Secondary | ICD-10-CM

## 2020-12-22 NOTE — Progress Notes (Signed)
Subjective:    Patient ID: Christopher Lawson, male    DOB: [birth year], 50 y.o.   MRN: [id number]  HPI Patient is a very nice 50 year old Caucasian gentleman who presents today with fatigue.  He states last week he had a severe GI illness.  He was having nausea and vomiting and diarrhea.  He states at 1 point, he was as gray as a sheet".  He states that the nausea vomiting and diarrhea has passed however this week he is extremely tired.  He has a lot of fatigue.  He states that he can fall asleep easily without reason.  He is also has some very mild lower back pain with breathing.  He denies any intense pleurisy or shortness of breath or hemoptysis.  The pain is located at the very bottom of his ribs roughly at the level of L1.  I am unable to reproduce the pain.  There is no CVA tenderness.  His lungs are completely clear to auscultation bilaterally.  He denies any shortness of breath.  He denies any fevers or chills.  He denies any dyspnea.  He states that it hurts more when he walks and twist making me believe it is musculoskeletal.  Past Medical History:  Diagnosis Date  . Abrasion of right index finger 11/20/2012  . Arthritis    left shoulder - to have surgery 12/12/2012  . Cellulitis of left lower limb   . Dental crown present   . Deviated nasal septum 11/2012  . Essential (primary) hypertension   . 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 Fatty (change of) liver, not elsewhere classified   . Fatty liver   . Hypertension    under control with meds., has been on med. x 5 yr.  . 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  . Obesity, unspecified   . Pain in left knee   . Pain in right foot   . Pain in right shoulder   . Sebaceous cyst   . Spontaneous rupture of other tendons, right upper arm   . Tinnitus, right ear   . Unilateral primary osteoarthritis, left knee    Past Surgical History:  Procedure Laterality Date  . bicep tendon surgery    . CYST EXCISION      wrist and back (same surgery)  . INGUINAL HERNIA REPAIR Right   . left shoulder surgery    . NASAL SEPTOPLASTY W/ TURBINOPLASTY Bilateral 11/27/2012   Procedure: NASAL SEPTOPLASTY WITH BILATERAL TURBINATE RESECTION;  Surgeon: Darletta Moll, MD;  Location: [facility];  Service: ENT;  Laterality: Bilateral;  . WISDOM TOOTH EXTRACTION     Current Outpatient Medications on File Prior to Visit  Medication Sig Dispense Refill  . amLODipine (NORVASC) 10 MG tablet TAKE (1) TABLET BY MOUTH ONCE DAILY. 90 tablet 1  . enalapril (VASOTEC) 10 MG tablet TAKE 2 TABLETS BY MOUTH  DAILY 180 tablet 1  . ibuprofen (ADVIL,MOTRIN) 200 MG tablet Take 200 mg by mouth every 6 (six) hours as needed.    . naproxen (NAPROSYN) 500 MG tablet Take 1 tablet (500 mg total) by mouth 2 (two) times daily with a meal. (Patient not taking: Reported on 12/22/2020) 60 tablet 5   No current facility-administered medications on file prior to visit.   Allergies  Allergen Reactions  . Other    Social History   Socioeconomic History  . Marital status: Married    Spouse name: Not  on file  . Number of children: Not on file  . Years of education: Not on file  . Highest education level: Not on file  Occupational History  . Not on file  Tobacco Use  . Smoking status: Never Smoker  . Smokeless tobacco: Never Used  Substance and Sexual Activity  . Alcohol use: Yes    Comment: occasionally  . Drug use: No  . Sexual activity: Not on file  Other Topics Concern  . Not on file  Social History Narrative  . Not on file   Social Determinants of Health   Financial Resource Strain: Not on file  Food Insecurity: Not on file  Transportation Needs: Not on file  Physical Activity: Not on file  Stress: Not on file  Social Connections: Not on file  Intimate Partner Violence: Not on file      Review of Systems  All other systems reviewed and are negative.      Objective:   Physical Exam Vitals reviewed.   Constitutional:      Appearance: Normal appearance. He is normal weight.  HENT:     Right Ear: Tympanic membrane and ear canal normal.     Left Ear: Tympanic membrane and ear canal normal.     Nose: Nose normal. No congestion or rhinorrhea.     Mouth/Throat:     Mouth: Mucous membranes are moist.     Pharynx: Oropharynx is clear. No oropharyngeal exudate or posterior oropharyngeal erythema.  Eyes:     Conjunctiva/sclera: Conjunctivae normal.     Pupils: Pupils are equal, round, and reactive to light.  Cardiovascular:     Rate and Rhythm: Normal rate and regular rhythm.     Heart sounds: Normal heart sounds. No murmur heard. No friction rub. No gallop.   Pulmonary:     Effort: Pulmonary effort is normal. No respiratory distress.     Breath sounds: Normal breath sounds. No wheezing, rhonchi or rales.  Abdominal:     General: Abdomen is flat. Bowel sounds are normal. There is no distension.     Palpations: Abdomen is soft.     Tenderness: There is no abdominal tenderness. There is no right CVA tenderness, left CVA tenderness, guarding or rebound.  Musculoskeletal:     Cervical back: Normal range of motion.  Lymphadenopathy:     Cervical: No cervical adenopathy.  Skin:    Coloration: Skin is not jaundiced.     Findings: No erythema or rash.  Neurological:     Mental Status: He is alert.           Assessment & Plan:  Postviral fatigue syndrome - Plan: CBC with Differential/Platelet, COMPLETE METABOLIC PANEL WITH GFR  Patient's exam today is reassuring.  I believe he has a postviral fatigue syndrome and can do something similar to mono.  Clinically he is recovered from the virus.  However he is now tired.  I believe this can take 7 to 10 days for this to totally resolve.  I recommended tincture of time.  I will check a CBC to rule out any evidence of bone marrow involvement causing leukopenia or anemia or thrombocytopenia.  I will also check a CMP to evaluate for any hepatitis  or dehydration.  Assuming lab work is normal, I recommended gradually building up his activity as tolerated.  He specifically denies any chest pain or shortness of breath that would make me think of any kind of viral myocarditis.

## 2020-12-23 LAB — CBC WITH DIFFERENTIAL/PLATELET
Absolute Monocytes: 611 cells/uL (ref 200–950)
Basophils Absolute: 34 cells/uL (ref 0–200)
Basophils Relative: 0.4 %
Eosinophils Absolute: 318 cells/uL (ref 15–500)
Eosinophils Relative: 3.7 %
HCT: 42.9 % (ref 38.5–50.0)
Hemoglobin: 14.5 g/dL (ref 13.2–17.1)
Lymphs Abs: 2098 cells/uL (ref 850–3900)
MCH: 30.7 pg (ref 27.0–33.0)
MCHC: 33.8 g/dL (ref 32.0–36.0)
MCV: 90.7 fL (ref 80.0–100.0)
MPV: 10.4 fL (ref 7.5–12.5)
Monocytes Relative: 7.1 %
Neutro Abs: 5538 cells/uL (ref 1500–7800)
Neutrophils Relative %: 64.4 %
Platelets: 256 10*3/uL (ref 140–400)
RBC: 4.73 10*6/uL (ref 4.20–5.80)
RDW: 12.4 % (ref 11.0–15.0)
Total Lymphocyte: 24.4 %
WBC: 8.6 10*3/uL (ref 3.8–10.8)

## 2020-12-23 LAB — COMPLETE METABOLIC PANEL WITH GFR
AG Ratio: 1.8 (calc) (ref 1.0–2.5)
ALT: 57 U/L — ABNORMAL HIGH (ref 9–46)
AST: 29 U/L (ref 10–35)
Albumin: 4.4 g/dL (ref 3.6–5.1)
Alkaline phosphatase (APISO): 90 U/L (ref 35–144)
BUN: 16 mg/dL (ref 7–25)
CO2: 25 mmol/L (ref 20–32)
Calcium: 9.4 mg/dL (ref 8.6–10.3)
Chloride: 108 mmol/L (ref 98–110)
Creat: 0.87 mg/dL (ref 0.70–1.33)
GFR, Est African American: 117 mL/min/{1.73_m2} (ref >=60)
GFR, Est Non African American: 101 mL/min/{1.73_m2} (ref >=60)
Globulin: 2.4 g/dL (calc) (ref 1.9–3.7)
Glucose, Bld: 85 mg/dL (ref 65–99)
Potassium: 4.2 mmol/L (ref 3.5–5.3)
Sodium: 141 mmol/L (ref 135–146)
Total Bilirubin: 0.5 mg/dL (ref 0.2–1.2)
Total Protein: 6.8 g/dL (ref 6.1–8.1)

## 2021-02-03 ENCOUNTER — Other Ambulatory Visit: Payer: Self-pay

## 2021-02-03 ENCOUNTER — Ambulatory Visit: Payer: 59 | Admitting: Orthopaedic Surgery

## 2021-02-03 ENCOUNTER — Encounter: Payer: Self-pay | Admitting: Orthopaedic Surgery

## 2021-02-03 VITALS — BP 139/87 | HR 77 | Ht 71.0 in | Wt 235.6 lb

## 2021-02-03 DIAGNOSIS — M542 Cervicalgia: Secondary | ICD-10-CM | POA: Diagnosis not present

## 2021-02-03 NOTE — Patient Instructions (Addendum)
Take Naprosyn 2 times daily for a couple weeks  When your fingers tingle, pay attention and let us know which finger is the worst, this will help [place] figure out which nerve is included   Also feet, pay attention to the areas that tingle and let us know when you return.

## 2021-02-03 NOTE — Progress Notes (Signed)
My neck started hurting again.  He stopped the Naprosyn and did well for a while but his neck started hurting again last week.  He took a Naprosyn and it helped.  He has not been taking regularly.  He has had some tingling in the fingers, he does not know if one finger tingles more than another.  He has no trauma, no weakness, no redness, no swelling.    He has noticed slight tingling at times of the right foot.  Encounter Diagnosis  Name Primary?   Cervical pain Yes   I have asked him to resume naprosyn 500 po bid pc daily.  I have asked him to try to notice which fingers are more numb.  I will see him in two weeks.  Call if any problem.  Precautions discussed.  Electronically Signed Darreld Mclean, MD 6/28/20222:51 PM

## 2021-02-17 ENCOUNTER — Other Ambulatory Visit: Payer: Self-pay

## 2021-02-17 ENCOUNTER — Encounter: Payer: Self-pay | Admitting: Orthopaedic Surgery

## 2021-02-17 ENCOUNTER — Ambulatory Visit: Payer: 59 | Admitting: Orthopaedic Surgery

## 2021-02-17 VITALS — BP 136/92 | HR 75 | Ht 71.0 in | Wt 232.0 lb

## 2021-02-17 DIAGNOSIS — M542 Cervicalgia: Secondary | ICD-10-CM

## 2021-02-17 NOTE — Progress Notes (Signed)
My neck and back are better.  He used the Naprosyn and his pain has significantly decreased.  He has no neck pain and only a slight tingling in the foot on the right.  He has no side effects from the Naprosyn.  He is doing what he wants to do.  ROM of neck is full.  NV intact.  Encounter Diagnosis  Name Primary?   Cervical pain Yes   I will see him as needed.  He can taper off or stop the Naprosyn as desired.  Resume if pain returns.  Call if any problem.  Precautions discussed.  Electronically Signed Darreld Mclean, MD 7/12/20223:40 PM

## 2021-04-09 ENCOUNTER — Other Ambulatory Visit: Payer: Self-pay

## 2021-04-09 ENCOUNTER — Encounter: Payer: Self-pay | Admitting: Orthopaedic Surgery

## 2021-04-09 ENCOUNTER — Ambulatory Visit: Payer: 59

## 2021-04-09 ENCOUNTER — Ambulatory Visit: Payer: 59 | Admitting: Orthopaedic Surgery

## 2021-04-09 VITALS — BP 133/83 | HR 65 | Ht 69.5 in | Wt 234.5 lb

## 2021-04-09 DIAGNOSIS — M5441 Lumbago with sciatica, right side: Secondary | ICD-10-CM

## 2021-04-09 DIAGNOSIS — G8929 Other chronic pain: Secondary | ICD-10-CM | POA: Diagnosis not present

## 2021-04-09 NOTE — Progress Notes (Signed)
My back and right leg hurt.  He has been having right sided sciatica pain and pain to the foot on the right for several months getting worse.  He has to drive daily to [facility] and when he gets out of car he has more pain.  When is not going to work, pain is much less.  He has no trauma.  Pain runs to the lateral right foot.  He has tried Naprosyn, rest, heat and ice.  He has no weakness.  He is not improving.    Spine/Pelvis examination:  Inspection:  Overall, sacoiliac joint benign and hips nontender; without crepitus or defects.   Thoracic spine inspection: Alignment normal without kyphosis present   Lumbar spine inspection:  Alignment  with normal lumbar lordosis, without scoliosis apparent.   Thoracic spine palpation:  without tenderness of spinal processes   Lumbar spine palpation: without tenderness of lumbar area; without tightness of lumbar muscles    Range of Motion:   Lumbar flexion, forward flexion is normal without pain or tenderness    Lumbar extension is full without pain or tenderness   Left lateral bend is normal without pain or tenderness   Right lateral bend is normal without pain or tenderness   Straight leg raising is normal  Strength & tone: normal   Stability overall normal stability  Encounter Diagnosis  Name Primary?   Chronic right-sided low back pain with right-sided sciatica Yes   X-rays were done of the lumbar spine, reported separately.  I will get MRI.  I am concerned about the L5 anterior compression and posterior narrowing which may cause HNP.  Continue the Naprosyn.  Adjust seat in car.  Return in two weeks.  Call if any problem.  Precautions discussed.  Electronically Signed Darreld Mclean, MD 9/1/20228:51 AM

## 2021-04-10 ENCOUNTER — Encounter: Payer: Self-pay | Admitting: Family Medicine

## 2021-04-14 MED ORDER — ENALAPRIL MALEATE 10 MG PO TABS: 20.0000 mg | ORAL_TABLET | Freq: Every day | ORAL | 1 refills | Status: DC

## 2021-04-14 MED ORDER — AMLODIPINE BESYLATE 10 MG PO TABS: ORAL_TABLET | ORAL | 1 refills | Status: DC

## 2021-04-21 ENCOUNTER — Other Ambulatory Visit: Payer: Self-pay

## 2021-04-21 ENCOUNTER — Ambulatory Visit (HOSPITAL_COMMUNITY)
Admission: RE | Admit: 2021-04-21 | Discharge: 2021-04-21 | Disposition: A | Discharge status: Home / Self Care | Payer: 59 | Source: Ambulatory Visit | Attending: Orthopaedic Surgery | Admitting: Orthopaedic Surgery

## 2021-04-21 DIAGNOSIS — M5441 Lumbago with sciatica, right side: Secondary | ICD-10-CM | POA: Diagnosis not present

## 2021-04-21 DIAGNOSIS — G8929 Other chronic pain: Secondary | ICD-10-CM

## 2021-04-28 ENCOUNTER — Ambulatory Visit: Payer: 59 | Admitting: Orthopaedic Surgery

## 2021-04-28 ENCOUNTER — Other Ambulatory Visit: Payer: Self-pay

## 2021-04-28 ENCOUNTER — Encounter: Payer: Self-pay | Admitting: Orthopaedic Surgery

## 2021-04-28 DIAGNOSIS — M48061 Spinal stenosis, lumbar region without neurogenic claudication: Secondary | ICD-10-CM

## 2021-04-28 DIAGNOSIS — G8929 Other chronic pain: Secondary | ICD-10-CM

## 2021-04-28 DIAGNOSIS — M5441 Lumbago with sciatica, right side: Secondary | ICD-10-CM

## 2021-04-28 NOTE — Patient Instructions (Signed)
You have been referred to [facility] on [address] in [place], they will call you with appointment. If you have not heard anything in one week call them to schedule [phone]   

## 2021-04-28 NOTE — Progress Notes (Signed)
My back still hurts.  He had the MRI of the lumbar spine showing: IMPRESSION: Lumbar spondylosis, as outlined and with findings most notably as follows.   At L4-L5, there is moderate disc degeneration. Moderate degenerative endplate edema along the right aspect of the disc space. Disc bulge with endplate spurring/osteophytic ridging. Superimposed broad-based central disc protrusion with associated osteophytic ridging. Facet arthrosis. Mild ligamentum flavum hypertrophy. Bilateral subarticular stenosis with potential to affect either descending L5 nerve root. Moderate/severe central canal stenosis. Severe bilateral neural foraminal narrowing.   At L5-S1, there is moderate disc degeneration. Disc bulge with endplate spurring/osteophytic ridging. Facet arthrosis. Mild left greater than right subarticular narrowing with disc osteophyte ridge likely contacting the descending left S1 nerve root. Moderate/severe bilateral neural foraminal narrowing.   No more than mild spinal canal narrowing at the remaining levels. Multifactorial bilateral neural foraminal narrowing at L3-L4 (mild right, moderate left).   Edema within the right L5 pedicle, which may be degenerative or may reflect stress reaction. Degenerative edema elsewhere within the posterior elements on the right at L4 and L5.   I have explained the findings to him.  I will have him see neurosurgery.  I have independently reviewed the MRI.    He is tender in the lower spine, no spasm, ROM is good, NV intact, reflexes intact, normal gait but he is in pain.  Encounter Diagnoses  Name Primary?   Chronic right-sided low back pain with right-sided sciatica Yes   Foraminal stenosis of lumbar region    To see neurosurgeon.  I can give note for work if needed.  Call if any problem.  Precautions discussed.  Electronically Signed Darreld Mclean, MD 9/20/20228:22 AM

## 2021-05-13 ENCOUNTER — Encounter (HOSPITAL_COMMUNITY): Payer: Self-pay | Admitting: Physical Therapy

## 2021-05-13 ENCOUNTER — Other Ambulatory Visit: Payer: Self-pay | Admitting: *Deleted

## 2021-05-13 ENCOUNTER — Other Ambulatory Visit: Payer: Self-pay

## 2021-05-13 ENCOUNTER — Ambulatory Visit (HOSPITAL_COMMUNITY): Payer: 59 | Attending: Neurosurgery | Admitting: Physical Therapy

## 2021-05-13 DIAGNOSIS — R262 Difficulty in walking, not elsewhere classified: Secondary | ICD-10-CM | POA: Insufficient documentation

## 2021-05-13 DIAGNOSIS — M6281 Muscle weakness (generalized): Secondary | ICD-10-CM

## 2021-05-13 DIAGNOSIS — M5442 Lumbago with sciatica, left side: Secondary | ICD-10-CM | POA: Diagnosis not present

## 2021-05-13 DIAGNOSIS — M5441 Lumbago with sciatica, right side: Secondary | ICD-10-CM | POA: Diagnosis present

## 2021-05-13 DIAGNOSIS — G8929 Other chronic pain: Secondary | ICD-10-CM | POA: Insufficient documentation

## 2021-05-13 MED ORDER — ENALAPRIL MALEATE 10 MG PO TABS: 20.0000 mg | ORAL_TABLET | Freq: Every day | ORAL | 1 refills | Status: DC

## 2021-05-13 MED ORDER — AMLODIPINE BESYLATE 10 MG PO TABS: ORAL_TABLET | ORAL | 1 refills | Status: DC

## 2021-05-13 NOTE — Therapy (Signed)
[facility] [address] [place], [place], [address] Phone: [phone]   Fax:  [phone]  Physical Therapy Evaluation  Patient Details  Name: Christopher Lawson MRN: [id number] Date of Birth: November 18, 1970 Referring Provider (PT): Tressie Stalker, MD   Encounter Date: 05/13/2021   PT End of Session - 05/13/21 1531     Visit Number 1    Number of Visits 12    Date for PT Re-Evaluation 06/24/21    Authorization Type [employer]    Progress Note Due on Visit 10    PT Start Time 1531    PT Stop Time 1604    PT Time Calculation (min) 33 min    Activity Tolerance Patient tolerated treatment well    Behavior During Therapy [facility] for tasks assessed/performed             Past Medical History:  Diagnosis Date   Abrasion of right index finger 11/20/2012   Arthritis    left shoulder - to have surgery 12/12/2012   Cellulitis of left lower limb    Dental crown present    Deviated nasal septum 11/2012   Essential (primary) hypertension    Factor V deficiency (HCC)    states never had a problem   Fatty (change of) liver, not elsewhere classified    Fatty liver    Hypertension    under control with meds., has been on med. x 5 yr.   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   Obesity, unspecified    Pain in left knee    Pain in right foot    Pain in right shoulder    Sebaceous cyst    Spontaneous rupture of other tendons, right upper arm    Tinnitus, right ear    Unilateral primary osteoarthritis, left knee     Past Surgical History:  Procedure Laterality Date   bicep tendon surgery     CYST EXCISION     wrist and back (same surgery)   INGUINAL HERNIA REPAIR Right    left shoulder surgery     NASAL SEPTOPLASTY W/ TURBINOPLASTY Bilateral 11/27/2012   Procedure: NASAL SEPTOPLASTY WITH BILATERAL TURBINATE RESECTION;  Surgeon: Darletta Moll, MD;  Location: [facility];  Service: ENT;  Laterality: Bilateral;   WISDOM  TOOTH EXTRACTION      There were no vitals filed for this visit.    Subjective Assessment - 05/13/21 1537     Subjective States that about 2 months ago he was having pain in his lower back and into his leg and into his foot. Reports numbness in his leg as if it is going asleep. States it has eased off some but it is still there and now it is in both legs with R>L and it is when he sits certain ways. Increased symptoms with putting on boots in the morning. States that he has had therapy on his back before and reports he needs knee surgery but he hasn't had it. Pain worse with sitting and in the morning    Currently in Pain? Yes    Pain Score 3     Pain Location Back    Pain Orientation Lower    Pain Descriptors / Indicators Aching;Discomfort    Pain Radiating Towards down left leg                [facility] PT Assessment - 05/13/21 0001       Assessment   Medical Diagnosis  LBP    Referring Provider (PT) Tressie Stalker, MD    Prior Therapy yes for low back      Balance Screen   Has the patient fallen in the past 6 months No      Observation/Other Assessments   Focus on Therapeutic Outcomes (FOTO)  58% function      ROM / Strength   AROM / PROM / Strength AROM;Strength      AROM   AROM Assessment Site Lumbar    Lumbar Flexion 50% function   stretching   Lumbar Extension 25% limited   reduced symptoms   Lumbar - Right Side Bend 50% limited   stretching   Lumbar - Left Side Bend 75% limited   slight pain in left side     Strength   Overall Strength Comments core strength with knee bent leg lowering - able to maintain post pelvic tilt to 45 degrees from table    Strength Assessment Site Hip;Knee;Ankle    Right/Left Hip Right;Left    Right Hip Flexion 5/5    Right Hip Extension 4/5    Right Hip ABduction 4-/5    Left Hip Flexion 4+/5    Left Hip Extension 4-/5    Left Hip ABduction 3+/5    Right/Left Knee Left;Right    Right Knee Flexion 4+/5    Right Knee Extension 5/5     Left Knee Flexion 4/5    Left Knee Extension 4+/5      Special Tests    Special Tests Lumbar    Lumbar Tests Prone Knee Bend Test      Prone Knee Bend Test   Findings Positive    Side Left    Comment and reduced ROM                        Objective measurements completed on examination: See above findings.       [facility] Adult PT Treatment/Exercise - 05/13/21 0001       Exercises   Exercises Lumbar      Lumbar Exercises: Stretches   Prone on Elbows Stretch 10 seconds;5 reps   3 sets     Lumbar Exercises: Prone   Other Prone Lumbar Exercises knee flexion B x15 5" holds                     PT Education - 05/13/21 1607     Education Details on current presentaiton, on FOTO score, on HEP andPOC moving forward    Person(s) Educated Patient    Methods Explanation    Comprehension Verbalized understanding              PT Short Term Goals - 05/13/21 1609       PT SHORT TERM GOAL #1   Title Patient will be able to lay on back without pain    Time 3    Period Weeks    Status New    Target Date 06/03/21      PT SHORT TERM GOAL #2   Title Patient will report at least 50% improvement in overall symptoms and/or function to demonstrate improved functional mobility    Time 3    Period Weeks    Status New    Target Date 06/03/21      PT SHORT TERM GOAL #3   Title Patient will be independent in self management strategies to improve quality of life and functional outcomes.    Time  3    Period Weeks    Status New    Target Date 06/03/21               PT Long Term Goals - 05/13/21 1610       PT LONG TERM GOAL #1   Title Patient will report at least 75% improvement in overall symptoms and/or function to demonstrate improved functional mobility    Time 6    Period Weeks    Status New    Target Date 06/24/21      PT LONG TERM GOAL #2   Title Patient will be able to maintain posterior pelvic tilt with bent knee leg lowering to  table to demonstrate improved core strength    Time 6    Period Weeks    Status New    Target Date 06/24/21      PT LONG TERM GOAL #3   Title Patient will meet predicted FOTO score to demonstrate improved overall function.    Time 6    Period Weeks    Status New    Target Date 06/24/21                    Plan - 05/13/21 1607     Clinical Impression Statement Patient is a 50 y.o. male who presents to physical therapy with complaint of low back pain. Patient demonstrates decreased strength, ROM restriction, and gait abnormalities which are likely contributing to symptoms of pain and are negatively impacting patient ability to perform ADLs and functional mobility tasks. Patient will benefit from skilled physical therapy services to address these deficits to reduce pain, improve level of function with ADLs, functional mobility tasks, and reduce risk for falls.    Personal Factors and Comorbidities Comorbidity 1    Comorbidities hx of back and knee pain    Examination-Activity Limitations Stand;Transfers;Locomotion Level;Bend;Sit    Examination-Participation Restrictions Driving;Occupation    Stability/Clinical Decision Making Stable/Uncomplicated    Clinical Decision Making Low    Rehab Potential Good    PT Frequency 2x / week    PT Duration 6 weeks    PT Treatment/Interventions ADLs/Self Care Home Management;Cryotherapy;Electrical Stimulation;Traction;Moist Heat;Therapeutic exercise;Therapeutic activities;Manual techniques;Neuromuscular re-education;Patient/family education;Joint Manipulations;Spinal Manipulations;Dry needling    PT Next Visit Plan hip hinge, hamstring/glute/core strengthening, lumbar and hip mobility    PT Home Exercise Plan prone press ups, knee flexion prone    Consulted and Agree with Plan of Care Patient             Patient will benefit from skilled therapeutic intervention in order to improve the following deficits and impairments:  Pain, Decreased  strength, Decreased activity tolerance, Decreased mobility, Decreased range of motion, Difficulty walking, Postural dysfunction, Hypomobility  Visit Diagnosis: Chronic bilateral low back pain with bilateral sciatica  Muscle weakness (generalized)  Difficulty in walking, not elsewhere classified     Problem List Patient Active Problem List   Diagnosis Date Noted   Hypertension 06/27/2019   Personal history of colonic polyps 06/05/2019   Fatty liver    Biceps tendon tear 08/19/2014   Elbow pain 04/30/2014   4:13 PM, 05/13/21 Tereasa Coop, DPT Physical Therapy with [facility]  [phone] office   [facility] [address] [place], [place], [address] Phone: [phone]   Fax:  [phone]  Name: LEONARD FEIGEL MRN: [id number] Date of Birth: 1971-05-02

## 2021-05-14 ENCOUNTER — Ambulatory Visit (HOSPITAL_COMMUNITY): Payer: 59 | Admitting: Physical Therapy

## 2021-05-14 ENCOUNTER — Encounter (HOSPITAL_COMMUNITY): Payer: Self-pay | Admitting: Physical Therapy

## 2021-05-14 DIAGNOSIS — M6281 Muscle weakness (generalized): Secondary | ICD-10-CM

## 2021-05-14 DIAGNOSIS — M5442 Lumbago with sciatica, left side: Secondary | ICD-10-CM | POA: Diagnosis not present

## 2021-05-14 DIAGNOSIS — R262 Difficulty in walking, not elsewhere classified: Secondary | ICD-10-CM

## 2021-05-14 DIAGNOSIS — G8929 Other chronic pain: Secondary | ICD-10-CM

## 2021-05-14 NOTE — Therapy (Signed)
[facility] [address] [place], [place], [address] Phone: [phone]   Fax:  [phone]  Physical Therapy Treatment  Patient Details  Name: Christopher Lawson MRN: [id number] Date of Birth: December 05, 1970 Referring Provider (PT): Tressie Stalker, MD   Encounter Date: 05/14/2021   PT End of Session - 05/14/21 1652     Visit Number 2    Number of Visits 12    Date for PT Re-Evaluation 06/24/21    Authorization Type [employer]    Progress Note Due on Visit 10    PT Start Time 1648    PT Stop Time 1729    PT Time Calculation (min) 41 min    Activity Tolerance Patient tolerated treatment well    Behavior During Therapy [facility] for tasks assessed/performed             Past Medical History:  Diagnosis Date   Abrasion of right index finger 11/20/2012   Arthritis    left shoulder - to have surgery 12/12/2012   Cellulitis of left lower limb    Dental crown present    Deviated nasal septum 11/2012   Essential (primary) hypertension    Factor V deficiency (HCC)    states never had a problem   Fatty (change of) liver, not elsewhere classified    Fatty liver    Hypertension    under control with meds., has been on med. x 5 yr.   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   Obesity, unspecified    Pain in left knee    Pain in right foot    Pain in right shoulder    Sebaceous cyst    Spontaneous rupture of other tendons, right upper arm    Tinnitus, right ear    Unilateral primary osteoarthritis, left knee     Past Surgical History:  Procedure Laterality Date   bicep tendon surgery     CYST EXCISION     wrist and back (same surgery)   INGUINAL HERNIA REPAIR Right    left shoulder surgery     NASAL SEPTOPLASTY W/ TURBINOPLASTY Bilateral 11/27/2012   Procedure: NASAL SEPTOPLASTY WITH BILATERAL TURBINATE RESECTION;  Surgeon: Darletta Moll, MD;  Location: [facility];  Service: ENT;  Laterality: Bilateral;   WISDOM  TOOTH EXTRACTION      There were no vitals filed for this visit.   Subjective Assessment - 05/14/21 1651     Subjective Patient says he is fine, a little stiff in back, about a 2-3/10. Home exercise is fine.    Currently in Pain? Yes    Pain Score 3     Pain Location Back    Pain Orientation Lower    Pain Descriptors / Indicators Aching;Sore                               [facility] Adult PT Treatment/Exercise - 05/14/21 0001       Lumbar Exercises: Stretches   Passive Hamstring Stretch 3 reps;30 seconds    Passive Hamstring Stretch Limitations seated    Other Lumbar Stretch Exercise RT piriformis stretch 30"      Lumbar Exercises: Seated   Sit to Stand 15 reps      Lumbar Exercises: Supine   Other Supine Lumbar Exercises prone pelvic tilt 10 x 5"      Lumbar Exercises: Sidelying   Hip Abduction Left;15 reps  Lumbar Exercises: Prone   Other Prone Lumbar Exercises prone press ups 5 x 15"    Other Prone Lumbar Exercises knee flexion B x15 5" holds, donkey kicks x10 each      Manual Therapy   Manual Therapy Soft tissue mobilization    Manual therapy comments completed separate form all other activity    Soft tissue mobilization STM to RT piriformis in LT sidelying                       PT Short Term Goals - 05/13/21 1609       PT SHORT TERM GOAL #1   Title Patient will be able to lay on back without pain    Time 3    Period Weeks    Status New    Target Date 06/03/21      PT SHORT TERM GOAL #2   Title Patient will report at least 50% improvement in overall symptoms and/or function to demonstrate improved functional mobility    Time 3    Period Weeks    Status New    Target Date 06/03/21      PT SHORT TERM GOAL #3   Title Patient will be independent in self management strategies to improve quality of life and functional outcomes.    Time 3    Period Weeks    Status New    Target Date 06/03/21               PT Long  Term Goals - 05/13/21 1610       PT LONG TERM GOAL #1   Title Patient will report at least 75% improvement in overall symptoms and/or function to demonstrate improved functional mobility    Time 6    Period Weeks    Status New    Target Date 06/24/21      PT LONG TERM GOAL #2   Title Patient will be able to maintain posterior pelvic tilt with bent knee leg lowering to table to demonstrate improved core strength    Time 6    Period Weeks    Status New    Target Date 06/24/21      PT LONG TERM GOAL #3   Title Patient will meet predicted FOTO score to demonstrate improved overall function.    Time 6    Period Weeks    Status New    Target Date 06/24/21                   Plan - 05/14/21 1848     Clinical Impression Statement Patient tolerated session well. Introduced patient to multiple LE strengthening exercises to address weakness found during eval. Patient challenged by side lying hip abduction and sit to stands with reports of cramping and tightness in posterior legs. Educated patient on benefits of stretching hamstrings and piriformis to alleviate symptoms of nerve pain down legs. Patient continues to require skilled therapy to address deficits in strength to return to PLOF.    Personal Factors and Comorbidities Comorbidity 1    Comorbidities hx of back and knee pain    Examination-Activity Limitations Stand;Transfers;Locomotion Level;Bend;Sit    Examination-Participation Restrictions Driving;Occupation    Stability/Clinical Decision Making Stable/Uncomplicated    Rehab Potential Good    PT Frequency 2x / week    PT Duration 6 weeks    PT Treatment/Interventions ADLs/Self Care Home Management;Cryotherapy;Electrical Stimulation;Traction;Moist Heat;Therapeutic exercise;Therapeutic activities;Manual techniques;Neuromuscular re-education;Patient/family education;Joint Manipulations;Spinal Manipulations;Dry needling  PT Next Visit Plan hip hinge, hamstring/glute/core  strengthening, lumbar and hip mobility    PT Home Exercise Plan prone press ups, knee flexion prone 10/6 piriformis stretch, sidelying hip abduction, HS stretch, prone hip extension    Consulted and Agree with Plan of Care Patient             Patient will benefit from skilled therapeutic intervention in order to improve the following deficits and impairments:  Pain, Decreased strength, Decreased activity tolerance, Decreased mobility, Decreased range of motion, Difficulty walking, Postural dysfunction, Hypomobility  Visit Diagnosis: Chronic bilateral low back pain with bilateral sciatica  Difficulty in walking, not elsewhere classified  Muscle weakness (generalized)     Problem List Patient Active Problem List   Diagnosis Date Noted   Hypertension 06/27/2019   Personal history of colonic polyps 06/05/2019   Fatty liver    Biceps tendon tear 08/19/2014   Elbow pain 04/30/2014   6:56 PM, 05/14/21 Georges Lynch PT DPT  Physical Therapist with Home  [facility]  [phone]   [facility] [address] [place], [place], [address] Phone: [phone]   Fax:  [phone]  Name: NORMA IGNASIAK MRN: [id number] Date of Birth: May 28, 1971

## 2021-05-14 NOTE — Patient Instructions (Signed)
Access Code: [email or web address] URL: [email or web address] Date: 05/14/2021 Prepared by: Georges Lynch  Exercises Seated Hamstring Stretch - 2-3 x daily - 7 x weekly - 1 sets - 3 reps - 30 seconds hold Sidelying Hip Abduction - 2-3 x daily - 7 x weekly - 2 sets - 15 reps Prone Hip Extension with Bent Knee - 2-3 x daily - 7 x weekly - 2 sets - 10 reps Piriformis Mobilization with Small Ball - 2-3 x daily - 7 x weekly - 1 sets - 1 reps - 3-5 minutes hold Standing Piriformis Release with Ball at Wall - 2-3 x daily - 7 x weekly - 1 sets - 1 reps - 3-5 minutes hold

## 2021-05-19 ENCOUNTER — Encounter (HOSPITAL_COMMUNITY): Payer: Self-pay | Admitting: Physical Therapy

## 2021-05-19 ENCOUNTER — Other Ambulatory Visit: Payer: Self-pay

## 2021-05-19 ENCOUNTER — Ambulatory Visit (HOSPITAL_COMMUNITY): Payer: 59 | Admitting: Physical Therapy

## 2021-05-19 DIAGNOSIS — M5442 Lumbago with sciatica, left side: Secondary | ICD-10-CM | POA: Diagnosis not present

## 2021-05-19 DIAGNOSIS — G8929 Other chronic pain: Secondary | ICD-10-CM

## 2021-05-19 DIAGNOSIS — R262 Difficulty in walking, not elsewhere classified: Secondary | ICD-10-CM

## 2021-05-19 DIAGNOSIS — M6281 Muscle weakness (generalized): Secondary | ICD-10-CM

## 2021-05-19 NOTE — Therapy (Signed)
[facility] [address] [place], [place], [address] Phone: [phone]   Fax:  [phone]  Physical Therapy Treatment  Patient Details  Name: Christopher Lawson MRN: [id number] Date of Birth: 12-Oct-1970 Referring Provider (PT): Tressie Stalker, MD   Encounter Date: 05/19/2021   PT End of Session - 05/19/21 0744     Visit Number 3    Number of Visits 12    Date for PT Re-Evaluation 06/24/21    Authorization Type [employer]    Progress Note Due on Visit 10    PT Start Time 0745    PT Stop Time 0825    PT Time Calculation (min) 40 min    Activity Tolerance Patient tolerated treatment well    Behavior During Therapy [facility] for tasks assessed/performed             Past Medical History:  Diagnosis Date   Abrasion of right index finger 11/20/2012   Arthritis    left shoulder - to have surgery 12/12/2012   Cellulitis of left lower limb    Dental crown present    Deviated nasal septum 11/2012   Essential (primary) hypertension    Factor V deficiency (HCC)    states never had a problem   Fatty (change of) liver, not elsewhere classified    Fatty liver    Hypertension    under control with meds., has been on med. x 5 yr.   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   Obesity, unspecified    Pain in left knee    Pain in right foot    Pain in right shoulder    Sebaceous cyst    Spontaneous rupture of other tendons, right upper arm    Tinnitus, right ear    Unilateral primary osteoarthritis, left knee     Past Surgical History:  Procedure Laterality Date   bicep tendon surgery     CYST EXCISION     wrist and back (same surgery)   INGUINAL HERNIA REPAIR Right    left shoulder surgery     NASAL SEPTOPLASTY W/ TURBINOPLASTY Bilateral 11/27/2012   Procedure: NASAL SEPTOPLASTY WITH BILATERAL TURBINATE RESECTION;  Surgeon: Darletta Moll, MD;  Location: [facility];  Service: ENT;  Laterality: Bilateral;    WISDOM TOOTH EXTRACTION      There were no vitals filed for this visit.   Subjective Assessment - 05/19/21 0751     Subjective States that he is stiff in his back and is 4/10 in the back.    Currently in Pain? Yes    Pain Score 4     Pain Location Back    Pain Orientation Lower    Pain Descriptors / Indicators Aching;Tightness                [facility] PT Assessment - 05/19/21 0001       Assessment   Medical Diagnosis LBP    Referring Provider (PT) Tressie Stalker, MD                           [facility] Adult PT Treatment/Exercise - 05/19/21 0001       Lumbar Exercises: Stretches   Piriformis Stretch 5 reps;20 seconds;Left;Right   hook lying in both IR and ER     Lumbar Exercises: Standing   Other Standing Lumbar Exercises lumbar extension at wall with stick 5 minutes    Other Standing Lumbar  Exercises self mobilization wtih tennis ball to piriformis.      Lumbar Exercises: Supine   Bridge Compliant;10 reps;5 seconds   alternating kick outs - 2 sets B   Other Supine Lumbar Exercises child's pose rock back from quadruped x5 15" holds      Lumbar Exercises: Prone   Other Prone Lumbar Exercises prone press up hold 2 minuute    Other Prone Lumbar Exercises knee flexion R x5 15" holds, donkey kicks x15 each                       PT Short Term Goals - 05/13/21 1609       PT SHORT TERM GOAL #1   Title Patient will be able to lay on back without pain    Time 3    Period Weeks    Status New    Target Date 06/03/21      PT SHORT TERM GOAL #2   Title Patient will report at least 50% improvement in overall symptoms and/or function to demonstrate improved functional mobility    Time 3    Period Weeks    Status New    Target Date 06/03/21      PT SHORT TERM GOAL #3   Title Patient will be independent in self management strategies to improve quality of life and functional outcomes.    Time 3    Period Weeks    Status New    Target Date  06/03/21               PT Long Term Goals - 05/13/21 1610       PT LONG TERM GOAL #1   Title Patient will report at least 75% improvement in overall symptoms and/or function to demonstrate improved functional mobility    Time 6    Period Weeks    Status New    Target Date 06/24/21      PT LONG TERM GOAL #2   Title Patient will be able to maintain posterior pelvic tilt with bent knee leg lowering to table to demonstrate improved core strength    Time 6    Period Weeks    Status New    Target Date 06/24/21      PT LONG TERM GOAL #3   Title Patient will meet predicted FOTO score to demonstrate improved overall function.    Time 6    Period Weeks    Status New    Target Date 06/24/21                   Plan - 05/19/21 0751     Clinical Impression Statement Overall patient tolerated session well. Continued to progress mobility exercises which reduced patient's symptoms. Overall improved ROM noted after exercises. Added new exercises to HEP.  Cues to breath and for form throughout session. No pain noted end of session.    Personal Factors and Comorbidities Comorbidity 1    Comorbidities hx of back and knee pain    Examination-Activity Limitations Stand;Transfers;Locomotion Level;Bend;Sit    Examination-Participation Restrictions Driving;Occupation    Stability/Clinical Decision Making Stable/Uncomplicated    Rehab Potential Good    PT Frequency 2x / week    PT Duration 6 weeks    PT Treatment/Interventions ADLs/Self Care Home Management;Cryotherapy;Electrical Stimulation;Traction;Moist Heat;Therapeutic exercise;Therapeutic activities;Manual techniques;Neuromuscular re-education;Patient/family education;Joint Manipulations;Spinal Manipulations;Dry needling    PT Next Visit Plan hip hinge, hamstring/glute/core strengthening, lumbar and hip mobility    PT Home  Exercise Plan prone press ups, knee flexion prone 10/6 piriformis stretch, sidelying hip abduction, HS stretch,  prone hip extension    Consulted and Agree with Plan of Care Patient             Patient will benefit from skilled therapeutic intervention in order to improve the following deficits and impairments:  Pain, Decreased strength, Decreased activity tolerance, Decreased mobility, Decreased range of motion, Difficulty walking, Postural dysfunction, Hypomobility  Visit Diagnosis: Chronic bilateral low back pain with bilateral sciatica  Difficulty in walking, not elsewhere classified  Muscle weakness (generalized)     Problem List Patient Active Problem List   Diagnosis Date Noted   Hypertension 06/27/2019   Personal history of colonic polyps 06/05/2019   Fatty liver    Biceps tendon tear 08/19/2014   Elbow pain 04/30/2014    8:27 AM, 05/19/21 Tereasa Coop, DPT Physical Therapy with [facility]  [phone] office   [facility] [address] [place], [place], [address] Phone: [phone]   Fax:  [phone]  Name: DORIS MCGILVERY MRN: [id number] Date of Birth: 10/09/1970

## 2021-06-01 ENCOUNTER — Ambulatory Visit (HOSPITAL_COMMUNITY): Payer: 59 | Admitting: Physical Therapy

## 2021-06-01 ENCOUNTER — Encounter (HOSPITAL_COMMUNITY): Payer: Self-pay | Admitting: Physical Therapy

## 2021-06-01 ENCOUNTER — Other Ambulatory Visit: Payer: Self-pay

## 2021-06-01 DIAGNOSIS — R262 Difficulty in walking, not elsewhere classified: Secondary | ICD-10-CM

## 2021-06-01 DIAGNOSIS — M6281 Muscle weakness (generalized): Secondary | ICD-10-CM

## 2021-06-01 DIAGNOSIS — G8929 Other chronic pain: Secondary | ICD-10-CM

## 2021-06-01 DIAGNOSIS — M5442 Lumbago with sciatica, left side: Secondary | ICD-10-CM | POA: Diagnosis not present

## 2021-06-01 NOTE — Therapy (Signed)
[facility] [address] [place], [place], [address] Phone: [phone]   Fax:  [phone]  Physical Therapy Treatment  Patient Details  Name: Christopher Lawson MRN: [id number] Date of Birth: [birth year] Referring Provider (PT): Tressie Stalker, MD   Encounter Date: 06/01/2021   PT End of Session - 06/01/21 1608     Visit Number 4    Number of Visits 12    Date for PT Re-Evaluation 06/24/21    Authorization Type [employer]    Progress Note Due on Visit 10    PT Start Time 1610    PT Stop Time 1653    PT Time Calculation (min) 43 min    Activity Tolerance Patient tolerated treatment well    Behavior During Therapy [facility] for tasks assessed/performed             Past Medical History:  Diagnosis Date   Abrasion of right index finger 11/20/2012   Arthritis    left shoulder - to have surgery 12/12/2012   Cellulitis of left lower limb    Dental crown present    Deviated nasal septum 11/2012   Essential (primary) hypertension    Factor V deficiency (HCC)    states never had a problem   Fatty (change of) liver, not elsewhere classified    Fatty liver    Hypertension    under control with meds., has been on med. x 5 yr.   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   Obesity, unspecified    Pain in left knee    Pain in right foot    Pain in right shoulder    Sebaceous cyst    Spontaneous rupture of other tendons, right upper arm    Tinnitus, right ear    Unilateral primary osteoarthritis, left knee     Past Surgical History:  Procedure Laterality Date   bicep tendon surgery     CYST EXCISION     wrist and back (same surgery)   INGUINAL HERNIA REPAIR Right    left shoulder surgery     NASAL SEPTOPLASTY W/ TURBINOPLASTY Bilateral 11/27/2012   Procedure: NASAL SEPTOPLASTY WITH BILATERAL TURBINATE RESECTION;  Surgeon: Darletta Moll, MD;  Location: [facility];  Service: ENT;  Laterality: Bilateral;   WISDOM  TOOTH EXTRACTION      There were no vitals filed for this visit.   Subjective Assessment - 06/01/21 1613     Subjective States that he has been riding a lot and is having increased 3/10. Reports that yesterday he had 5/10 pain, woke up irritated and nothing helped it. States that his dad has Parkinson's and he had to sit a bit and doesn't think that helped    Currently in Pain? Yes    Pain Score 3     Pain Location Back    Pain Orientation Right;Lower    Pain Descriptors / Indicators Aching;Tingling    Pain Radiating Towards down right leg                               [facility] Adult PT Treatment/Exercise - 06/01/21 0001       Lumbar Exercises: Standing   Other Standing Lumbar Exercises left ROT and extension over hand as fulcrum 4x5 L      Lumbar Exercises: Supine   Other Supine Lumbar Exercises 90/90 relief posiiton - felt good; long exhale for reduciton in  intra-abdominal pressure - of practice      Manual Therapy   Manual Therapy Soft tissue mobilization    Manual therapy comments completed separate form all other activity    Soft tissue mobilization STM and istrument assisted STM (with percussion with small adapter) to B QL and lumbar paraspinals                     PT Education - 06/01/21 1658     Education Details on anatomy, on muscular pain and symptoms, on intraabdominal pressure and pain associated with it.    Person(s) Educated Patient    Methods Explanation    Comprehension Verbalized understanding              PT Short Term Goals - 05/13/21 1609       PT SHORT TERM GOAL #1   Title Patient will be able to lay on back without pain    Time 3    Period Weeks    Status New    Target Date 06/03/21      PT SHORT TERM GOAL #2   Title Patient will report at least 50% improvement in overall symptoms and/or function to demonstrate improved functional mobility    Time 3    Period Weeks    Status New    Target Date  06/03/21      PT SHORT TERM GOAL #3   Title Patient will be independent in self management strategies to improve quality of life and functional outcomes.    Time 3    Period Weeks    Status New    Target Date 06/03/21               PT Long Term Goals - 05/13/21 1610       PT LONG TERM GOAL #1   Title Patient will report at least 75% improvement in overall symptoms and/or function to demonstrate improved functional mobility    Time 6    Period Weeks    Status New    Target Date 06/24/21      PT LONG TERM GOAL #2   Title Patient will be able to maintain posterior pelvic tilt with bent knee leg lowering to table to demonstrate improved core strength    Time 6    Period Weeks    Status New    Target Date 06/24/21      PT LONG TERM GOAL #3   Title Patient will meet predicted FOTO score to demonstrate improved overall function.    Time 6    Period Weeks    Status New    Target Date 06/24/21                   Plan - 06/01/21 1700     Clinical Impression Statement Patient with frustration as pain not consistent. Educated patient on current presentation and how likely musculature in nature. Educated patient n anatomy, rationale for exercises and intra-abdominal pressure. Added positional relief, breathing exercise and standing over fulcrum to HEP. Will follow up with next session.    Personal Factors and Comorbidities Comorbidity 1    Comorbidities hx of back and knee pain    Examination-Activity Limitations Stand;Transfers;Locomotion Level;Bend;Sit    Examination-Participation Restrictions Driving;Occupation    Stability/Clinical Decision Making Stable/Uncomplicated    Rehab Potential Good    PT Frequency 2x / week    PT Duration 6 weeks    PT Treatment/Interventions ADLs/Self Care Home Management;Cryotherapy;Lobbyist  Stimulation;Traction;Moist Heat;Therapeutic exercise;Therapeutic activities;Manual techniques;Neuromuscular re-education;Patient/family  education;Joint Manipulations;Spinal Manipulations;Dry needling    PT Next Visit Plan TRA activation, reduced intra-abdominal pressure, hip hinge, hamstring/glute/core strengthening, lumbar and hip mobility    PT Home Exercise Plan prone press ups, knee flexion prone 10/6 piriformis stretch, sidelying hip abduction, HS stretch, prone hip extension; 10/24 breathing long exhale, left ext/SB in standing, positional relief    Consulted and Agree with Plan of Care Patient             Patient will benefit from skilled therapeutic intervention in order to improve the following deficits and impairments:  Pain, Decreased strength, Decreased activity tolerance, Decreased mobility, Decreased range of motion, Difficulty walking, Postural dysfunction, Hypomobility  Visit Diagnosis: Chronic bilateral low back pain with bilateral sciatica  Difficulty in walking, not elsewhere classified  Muscle weakness (generalized)     Problem List Patient Active Problem List   Diagnosis Date Noted   Hypertension 06/27/2019   Personal history of colonic polyps 06/05/2019   Fatty liver    Biceps tendon tear 08/19/2014   Elbow pain 04/30/2014   5:02 PM, 06/01/21 Tereasa Coop, DPT Physical Therapy with [facility]  [phone] office   [facility] [address] [place], [place], [address] Phone: [phone]   Fax:  [phone]  Name: ADAMA FERBER MRN: [id number] Date of Birth: 30-May-1971

## 2021-06-04 ENCOUNTER — Encounter (HOSPITAL_COMMUNITY): Payer: Self-pay | Admitting: Physical Therapy

## 2021-06-04 ENCOUNTER — Ambulatory Visit (HOSPITAL_COMMUNITY): Payer: 59 | Admitting: Physical Therapy

## 2021-06-04 ENCOUNTER — Other Ambulatory Visit: Payer: Self-pay

## 2021-06-04 DIAGNOSIS — M6281 Muscle weakness (generalized): Secondary | ICD-10-CM

## 2021-06-04 DIAGNOSIS — M5442 Lumbago with sciatica, left side: Secondary | ICD-10-CM | POA: Diagnosis not present

## 2021-06-04 DIAGNOSIS — G8929 Other chronic pain: Secondary | ICD-10-CM

## 2021-06-04 DIAGNOSIS — M5441 Lumbago with sciatica, right side: Secondary | ICD-10-CM

## 2021-06-04 DIAGNOSIS — R262 Difficulty in walking, not elsewhere classified: Secondary | ICD-10-CM

## 2021-06-04 NOTE — Therapy (Signed)
[facility] [address] [place], [place], [address] Phone: [phone]   Fax:  [phone]  Physical Therapy Treatment  Patient Details  Name: Christopher Lawson MRN: [id number] Date of Birth: 07/19/1971 Referring Provider (PT): Tressie Stalker, MD   Encounter Date: 06/04/2021   PT End of Session - 06/04/21 0745     Visit Number 5    Number of Visits 12    Date for PT Re-Evaluation 06/24/21    Authorization Type [employer]    Progress Note Due on Visit 10    PT Start Time [id number]    PT Stop Time 0828    PT Time Calculation (min) 42 min    Activity Tolerance Patient tolerated treatment well    Behavior During Therapy [facility] for tasks assessed/performed             Past Medical History:  Diagnosis Date   Abrasion of right index finger 11/20/2012   Arthritis    left shoulder - to have surgery 12/12/2012   Cellulitis of left lower limb    Dental crown present    Deviated nasal septum 11/2012   Essential (primary) hypertension    Factor V deficiency (HCC)    states never had a problem   Fatty (change of) liver, not elsewhere classified    Fatty liver    Hypertension    under control with meds., has been on med. x 5 yr.   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   Obesity, unspecified    Pain in left knee    Pain in right foot    Pain in right shoulder    Sebaceous cyst    Spontaneous rupture of other tendons, right upper arm    Tinnitus, right ear    Unilateral primary osteoarthritis, left knee     Past Surgical History:  Procedure Laterality Date   bicep tendon surgery     CYST EXCISION     wrist and back (same surgery)   INGUINAL HERNIA REPAIR Right    left shoulder surgery     NASAL SEPTOPLASTY W/ TURBINOPLASTY Bilateral 11/27/2012   Procedure: NASAL SEPTOPLASTY WITH BILATERAL TURBINATE RESECTION;  Surgeon: Darletta Moll, MD;  Location: [facility];  Service: ENT;  Laterality: Bilateral;   WISDOM  TOOTH EXTRACTION      There were no vitals filed for this visit.   Subjective Assessment - 06/04/21 0746     Subjective Still has most issues in the morning. He does some stretches usually in the morning. Rot-ext stretches have helped.    Currently in Pain? Yes    Pain Score 2     Pain Location Back    Pain Orientation Lower    Pain Descriptors / Indicators Aching                               [facility] Adult PT Treatment/Exercise - 06/04/21 0001       Lumbar Exercises: Stretches   Piriformis Stretch Right;3 reps;20 seconds    Piriformis Stretch Limitations SKTC      Lumbar Exercises: Standing   Other Standing Lumbar Exercises R ROT and extension over hand as fulcrum 1x10 R    Other Standing Lumbar Exercises hip hinge 4x 10      Lumbar Exercises: Seated   Other Seated Lumbar Exercises pelvic tilts x 20      Lumbar Exercises: Supine  Pelvic Tilt 20 reps    Other Supine Lumbar Exercises dead bug iso with green ball 10x 5 second holds    Other Supine Lumbar Exercises DKTC with green ball with cueing for TRA activation 10x 5 second holds                     PT Education - 06/04/21 0746     Education Details HEP    Person(s) Educated Patient    Methods Explanation;Demonstration    Comprehension Verbalized understanding;Returned demonstration              PT Short Term Goals - 05/13/21 1609       PT SHORT TERM GOAL #1   Title Patient will be able to lay on back without pain    Time 3    Period Weeks    Status New    Target Date 06/03/21      PT SHORT TERM GOAL #2   Title Patient will report at least 50% improvement in overall symptoms and/or function to demonstrate improved functional mobility    Time 3    Period Weeks    Status New    Target Date 06/03/21      PT SHORT TERM GOAL #3   Title Patient will be independent in self management strategies to improve quality of life and functional outcomes.    Time 3    Period Weeks     Status New    Target Date 06/03/21               PT Long Term Goals - 05/13/21 1610       PT LONG TERM GOAL #1   Title Patient will report at least 75% improvement in overall symptoms and/or function to demonstrate improved functional mobility    Time 6    Period Weeks    Status New    Target Date 06/24/21      PT LONG TERM GOAL #2   Title Patient will be able to maintain posterior pelvic tilt with bent knee leg lowering to table to demonstrate improved core strength    Time 6    Period Weeks    Status New    Target Date 06/24/21      PT LONG TERM GOAL #3   Title Patient will meet predicted FOTO score to demonstrate improved overall function.    Time 6    Period Weeks    Status New    Target Date 06/24/21                   Plan - 06/04/21 0746     Clinical Impression Statement Patient with continued low back/glute symptoms with tenderness to palpation of R glute near PSIS. Patient given manual and verbal cueing with pelvic tilts with good carry over. Slight decrease in symptoms with piriformis/glute stretch. He has difficulty with hip hinge exercise despite extensive multimodal cueing and use of PVC for posture. He does complete with improving mechanics with fingers on ASIS and cueing to point to floor. Revisit mechanics next session. Patient will continue to benefit from skilled physical therapy in order to reduce impairment and improve function.    Personal Factors and Comorbidities Comorbidity 1    Comorbidities hx of back and knee pain    Examination-Activity Limitations Stand;Transfers;Locomotion Level;Bend;Sit    Examination-Participation Restrictions Driving;Occupation    Stability/Clinical Decision Making Stable/Uncomplicated    Rehab Potential Good    PT Frequency 2x /  week    PT Duration 6 weeks    PT Treatment/Interventions ADLs/Self Care Home Management;Cryotherapy;Electrical Stimulation;Traction;Moist Heat;Therapeutic exercise;Therapeutic  activities;Manual techniques;Neuromuscular re-education;Patient/family education;Joint Manipulations;Spinal Manipulations;Dry needling    PT Next Visit Plan Revisit hip hinge mechanics; TRA activation, reduced intra-abdominal pressure, hip hinge, hamstring/glute/core strengthening, lumbar and hip mobility    PT Home Exercise Plan prone press ups, knee flexion prone 10/6 piriformis stretch, sidelying hip abduction, HS stretch, prone hip extension; 10/24 breathing long exhale, left ext/SB in standing, positional relief 10/27 pelvic tilts    Consulted and Agree with Plan of Care Patient             Patient will benefit from skilled therapeutic intervention in order to improve the following deficits and impairments:  Pain, Decreased strength, Decreased activity tolerance, Decreased mobility, Decreased range of motion, Difficulty walking, Postural dysfunction, Hypomobility  Visit Diagnosis: Chronic bilateral low back pain with bilateral sciatica  Difficulty in walking, not elsewhere classified  Muscle weakness (generalized)     Problem List Patient Active Problem List   Diagnosis Date Noted   Hypertension 06/27/2019   Personal history of colonic polyps 06/05/2019   Fatty liver    Biceps tendon tear 08/19/2014   Elbow pain 04/30/2014   8:33 AM, 06/04/21 Wyman Songster PT, DPT Physical Therapist at [facility]    [facility] [address] [place], [place], [address] Phone: [phone]   Fax:  [phone]  Name: Christopher Lawson MRN: [id number] Date of Birth: [birth year]

## 2021-06-04 NOTE — Patient Instructions (Signed)
Access Code: [email or web address] URL: [email or web address] Date: 06/04/2021 Prepared by: Greig Castilla Corah Willeford  Exercises Seated Pelvic Tilt - 1 x daily - 7 x weekly - 1 sets - 20 reps

## 2021-06-10 ENCOUNTER — Encounter (HOSPITAL_COMMUNITY): Payer: 59 | Admitting: Physical Therapy

## 2021-06-12 ENCOUNTER — Encounter (HOSPITAL_COMMUNITY): Payer: 59 | Admitting: Physical Therapy

## 2021-06-15 ENCOUNTER — Other Ambulatory Visit: Payer: Self-pay

## 2021-06-15 ENCOUNTER — Ambulatory Visit (HOSPITAL_COMMUNITY): Payer: 59 | Attending: Neurosurgery | Admitting: Physical Therapy

## 2021-06-15 DIAGNOSIS — M5441 Lumbago with sciatica, right side: Secondary | ICD-10-CM | POA: Insufficient documentation

## 2021-06-15 DIAGNOSIS — M6281 Muscle weakness (generalized): Secondary | ICD-10-CM | POA: Diagnosis present

## 2021-06-15 DIAGNOSIS — M5442 Lumbago with sciatica, left side: Secondary | ICD-10-CM | POA: Diagnosis not present

## 2021-06-15 DIAGNOSIS — G8929 Other chronic pain: Secondary | ICD-10-CM | POA: Diagnosis present

## 2021-06-15 DIAGNOSIS — R262 Difficulty in walking, not elsewhere classified: Secondary | ICD-10-CM | POA: Diagnosis present

## 2021-06-15 NOTE — Therapy (Signed)
[facility] [address] [place], [place], [address] Phone: [phone]   Fax:  [phone]  Physical Therapy Treatment  Patient Details  Name: Christopher Lawson MRN: [id number] Date of Birth: May 01, 1971 Referring Provider (PT): Tressie Stalker, MD   Encounter Date: 06/15/2021   PT End of Session - 06/15/21 1656     Visit Number 6    Number of Visits 12    Date for PT Re-Evaluation 06/24/21    Authorization Type [employer]    Progress Note Due on Visit 10    PT Start Time 1646    PT Stop Time 1740    PT Time Calculation (min) 54 min    Activity Tolerance Patient tolerated treatment well    Behavior During Therapy [facility] for tasks assessed/performed             Past Medical History:  Diagnosis Date   Abrasion of right index finger 11/20/2012   Arthritis    left shoulder - to have surgery 12/12/2012   Cellulitis of left lower limb    Dental crown present    Deviated nasal septum 11/2012   Essential (primary) hypertension    Factor V deficiency (HCC)    states never had a problem   Fatty (change of) liver, not elsewhere classified    Fatty liver    Hypertension    under control with meds., has been on med. x 5 yr.   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   Obesity, unspecified    Pain in left knee    Pain in right foot    Pain in right shoulder    Sebaceous cyst    Spontaneous rupture of other tendons, right upper arm    Tinnitus, right ear    Unilateral primary osteoarthritis, left knee     Past Surgical History:  Procedure Laterality Date   bicep tendon surgery     CYST EXCISION     wrist and back (same surgery)   INGUINAL HERNIA REPAIR Right    left shoulder surgery     NASAL SEPTOPLASTY W/ TURBINOPLASTY Bilateral 11/27/2012   Procedure: NASAL SEPTOPLASTY WITH BILATERAL TURBINATE RESECTION;  Surgeon: Darletta Moll, MD;  Location: [facility];  Service: ENT;  Laterality: Bilateral;   WISDOM  TOOTH EXTRACTION      There were no vitals filed for this visit.                      [facility] Adult PT Treatment/Exercise - 06/15/21 0001       Lumbar Exercises: Sidelying   Other Sidelying Lumbar Exercises planks 15" each      Lumbar Exercises: Prone   Other Prone Lumbar Exercises prone hip extension x5 (form review)      Lumbar Exercises: Quadruped   Other Quadruped Lumbar Exercises hip extension x15, birddogs x15                       PT Short Term Goals - 05/13/21 1609       PT SHORT TERM GOAL #1   Title Patient will be able to lay on back without pain    Time 3    Period Weeks    Status New    Target Date 06/03/21      PT SHORT TERM GOAL #2   Title Patient will report at least 50% improvement in overall symptoms and/or function to demonstrate improved  functional mobility    Time 3    Period Weeks    Status New    Target Date 06/03/21      PT SHORT TERM GOAL #3   Title Patient will be independent in self management strategies to improve quality of life and functional outcomes.    Time 3    Period Weeks    Status New    Target Date 06/03/21               PT Long Term Goals - 05/13/21 1610       PT LONG TERM GOAL #1   Title Patient will report at least 75% improvement in overall symptoms and/or function to demonstrate improved functional mobility    Time 6    Period Weeks    Status New    Target Date 06/24/21      PT LONG TERM GOAL #2   Title Patient will be able to maintain posterior pelvic tilt with bent knee leg lowering to table to demonstrate improved core strength    Time 6    Period Weeks    Status New    Target Date 06/24/21      PT LONG TERM GOAL #3   Title Patient will meet predicted FOTO score to demonstrate improved overall function.    Time 6    Period Weeks    Status New    Target Date 06/24/21                   Plan - 06/15/21 1749     Clinical Impression Statement Patient tolerated  session well. Answered questions and reviewed bio mechanics with prior ther ex. Progressed quadruped activity to birddogs. Cued on TA activation and sequencing. Also progressed core exercises to include side planks. Patient tolerated well but fatigues quickly. No increased pain noted this session. Patient will continue to benefit from skilled therapy services to reduce deficits and improve functional ability.    Personal Factors and Comorbidities Comorbidity 1    Comorbidities hx of back and knee pain    Examination-Activity Limitations Stand;Transfers;Locomotion Level;Bend;Sit    Examination-Participation Restrictions Driving;Occupation    Stability/Clinical Decision Making Stable/Uncomplicated    Rehab Potential Good    PT Frequency 2x / week    PT Duration 6 weeks    PT Treatment/Interventions ADLs/Self Care Home Management;Cryotherapy;Electrical Stimulation;Traction;Moist Heat;Therapeutic exercise;Therapeutic activities;Manual techniques;Neuromuscular re-education;Patient/family education;Joint Manipulations;Spinal Manipulations;Dry needling    PT Next Visit Plan Revisit hip hinge mechanics; TRA activation, reduced intra-abdominal pressure, hip hinge, hamstring/glute/core strengthening, lumbar and hip mobility    PT Home Exercise Plan prone press ups, knee flexion prone 10/6 piriformis stretch, sidelying hip abduction, HS stretch, prone hip extension; 10/24 breathing long exhale, left ext/SB in standing, positional relief 10/27 pelvic tilts 11/7 side planks    Consulted and Agree with Plan of Care Patient             Patient will benefit from skilled therapeutic intervention in order to improve the following deficits and impairments:  Pain, Decreased strength, Decreased activity tolerance, Decreased mobility, Decreased range of motion, Difficulty walking, Postural dysfunction, Hypomobility  Visit Diagnosis: Chronic bilateral low back pain with bilateral sciatica  Difficulty in walking,  not elsewhere classified  Muscle weakness (generalized)     Problem List Patient Active Problem List   Diagnosis Date Noted   Hypertension 06/27/2019   Personal history of colonic polyps 06/05/2019   Fatty liver    Biceps tendon tear 08/19/2014  Elbow pain 04/30/2014   5:51 PM, 06/15/21 Georges Lynch PT DPT  Physical Therapist with [facility]  [facility]  [phone]  [facility] [address] [place], [place], [address] Phone: [phone]   Fax:  [phone]  Name: Christopher Lawson MRN: [id number] Date of Birth: Apr 13, 1971

## 2021-06-17 ENCOUNTER — Other Ambulatory Visit: Payer: Self-pay

## 2021-06-17 ENCOUNTER — Encounter (HOSPITAL_COMMUNITY): Payer: Self-pay | Admitting: Physical Therapy

## 2021-06-17 ENCOUNTER — Ambulatory Visit (HOSPITAL_COMMUNITY): Payer: 59 | Admitting: Physical Therapy

## 2021-06-17 DIAGNOSIS — M6281 Muscle weakness (generalized): Secondary | ICD-10-CM

## 2021-06-17 DIAGNOSIS — R262 Difficulty in walking, not elsewhere classified: Secondary | ICD-10-CM

## 2021-06-17 DIAGNOSIS — M5442 Lumbago with sciatica, left side: Secondary | ICD-10-CM | POA: Diagnosis not present

## 2021-06-17 DIAGNOSIS — G8929 Other chronic pain: Secondary | ICD-10-CM

## 2021-06-17 DIAGNOSIS — M5441 Lumbago with sciatica, right side: Secondary | ICD-10-CM

## 2021-06-17 NOTE — Therapy (Signed)
[facility] [address] [place], [place], [address] Phone: [phone]   Fax:  [phone]  Physical Therapy Treatment  Patient Details  Name: OBED SAMEK MRN: [id number] Date of Birth: [birth year] Referring Provider (PT): Tressie Stalker, MD   Encounter Date: 06/17/2021   PT End of Session - 06/17/21 1652     Visit Number 7    Number of Visits 12    Date for PT Re-Evaluation 06/24/21    Authorization Type [employer]    Progress Note Due on Visit 10    PT Start Time 1647    PT Stop Time 1728    PT Time Calculation (min) 41 min    Activity Tolerance Patient tolerated treatment well    Behavior During Therapy [facility] for tasks assessed/performed             Past Medical History:  Diagnosis Date   Abrasion of right index finger 11/20/2012   Arthritis    left shoulder - to have surgery 12/12/2012   Cellulitis of left lower limb    Dental crown present    Deviated nasal septum 11/2012   Essential (primary) hypertension    Factor V deficiency (HCC)    states never had a problem   Fatty (change of) liver, not elsewhere classified    Fatty liver    Hypertension    under control with meds., has been on med. x 5 yr.   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   Obesity, unspecified    Pain in left knee    Pain in right foot    Pain in right shoulder    Sebaceous cyst    Spontaneous rupture of other tendons, right upper arm    Tinnitus, right ear    Unilateral primary osteoarthritis, left knee     Past Surgical History:  Procedure Laterality Date   bicep tendon surgery     CYST EXCISION     wrist and back (same surgery)   INGUINAL HERNIA REPAIR Right    left shoulder surgery     NASAL SEPTOPLASTY W/ TURBINOPLASTY Bilateral 11/27/2012   Procedure: NASAL SEPTOPLASTY WITH BILATERAL TURBINATE RESECTION;  Surgeon: Darletta Moll, MD;  Location: [facility];  Service: ENT;  Laterality: Bilateral;   WISDOM  TOOTH EXTRACTION      There were no vitals filed for this visit.   Subjective Assessment - 06/17/21 1652     Subjective Doing well had a brief episode with his back earlier today, but quickly resolved. No issues since.    Currently in Pain? No/denies                               [facility] Adult PT Treatment/Exercise - 06/17/21 0001       Lumbar Exercises: Stretches   Piriformis Stretch 2 reps;30 seconds      Lumbar Exercises: Standing   Other Standing Lumbar Exercises GTB sidestepping 3RT, GTB monster walks 3RT, 7 inch step power ups x15 each    Other Standing Lumbar Exercises alt march with band extension GTB 2 x 15, RTB palloff press 2 x 20, RTB palloff walkouts x10 each      Lumbar Exercises: Supine   Dead Bug 20 reps      Lumbar Exercises: Prone   Other Prone Lumbar Exercises plank series, side/ side/ front 2 x 15" each      Lumbar  Exercises: Quadruped   Other Quadruped Lumbar Exercises birddogs 2x10                       PT Short Term Goals - 05/13/21 1609       PT SHORT TERM GOAL #1   Title Patient will be able to lay on back without pain    Time 3    Period Weeks    Status New    Target Date 06/03/21      PT SHORT TERM GOAL #2   Title Patient will report at least 50% improvement in overall symptoms and/or function to demonstrate improved functional mobility    Time 3    Period Weeks    Status New    Target Date 06/03/21      PT SHORT TERM GOAL #3   Title Patient will be independent in self management strategies to improve quality of life and functional outcomes.    Time 3    Period Weeks    Status New    Target Date 06/03/21               PT Long Term Goals - 05/13/21 1610       PT LONG TERM GOAL #1   Title Patient will report at least 75% improvement in overall symptoms and/or function to demonstrate improved functional mobility    Time 6    Period Weeks    Status New    Target Date 06/24/21      PT LONG  TERM GOAL #2   Title Patient will be able to maintain posterior pelvic tilt with bent knee leg lowering to table to demonstrate improved core strength    Time 6    Period Weeks    Status New    Target Date 06/24/21      PT LONG TERM GOAL #3   Title Patient will meet predicted FOTO score to demonstrate improved overall function.    Time 6    Period Weeks    Status New    Target Date 06/24/21                   Plan - 06/17/21 1725     Clinical Impression Statement Patient tolerated ther ex processions well today with no increased complaint of pain. Patient cued on proper form and mechanics of added activity and on TA activation with added palloff press and palloff walkouts. Patient most challenged with dead bugs. Required verbal cues for sequencing. Showing good static balance with power ups on 7-inch step. Patient will continue to benefit from skilled therapy services to progress core strength for decreased back pain and improved functional ability.    Personal Factors and Comorbidities Comorbidity 1    Comorbidities hx of back and knee pain    Examination-Activity Limitations Stand;Transfers;Locomotion Level;Bend;Sit    Examination-Participation Restrictions Driving;Occupation    Stability/Clinical Decision Making Stable/Uncomplicated    Rehab Potential Good    PT Frequency 2x / week    PT Duration 6 weeks    PT Treatment/Interventions ADLs/Self Care Home Management;Cryotherapy;Electrical Stimulation;Traction;Moist Heat;Therapeutic exercise;Therapeutic activities;Manual techniques;Neuromuscular re-education;Patient/family education;Joint Manipulations;Spinal Manipulations;Dry needling    PT Next Visit Plan Goblet squat, dead lifts, box lifts. Lifts and chops. TRA activation, reduced intra-abdominal pressure, hip hinge, hamstring/glute/core strengthening, lumbar and hip mobility    PT Home Exercise Plan prone press ups, knee flexion prone 10/6 piriformis stretch, sidelying hip  abduction, HS stretch, prone hip extension; 10/24 breathing long  exhale, left ext/SB in standing, positional relief 10/27 pelvic tilts 11/7 side planks 11/9 band sidestepping    Consulted and Agree with Plan of Care Patient             Patient will benefit from skilled therapeutic intervention in order to improve the following deficits and impairments:  Pain, Decreased strength, Decreased activity tolerance, Decreased mobility, Decreased range of motion, Difficulty walking, Postural dysfunction, Hypomobility  Visit Diagnosis: Chronic bilateral low back pain with bilateral sciatica  Difficulty in walking, not elsewhere classified  Muscle weakness (generalized)     Problem List Patient Active Problem List   Diagnosis Date Noted   Hypertension 06/27/2019   Personal history of colonic polyps 06/05/2019   Fatty liver    Biceps tendon tear 08/19/2014   Elbow pain 04/30/2014   5:49 PM, 06/17/21 Georges Lynch PT DPT  Physical Therapist with [facility]  [facility]  [phone]   [facility] [address] [place], [place], [address] Phone: [phone]   Fax:  [phone]  Name: TREYSEN SUDBECK MRN: [id number] Date of Birth: April 16, 1971

## 2021-06-22 ENCOUNTER — Encounter (HOSPITAL_COMMUNITY): Payer: Self-pay | Admitting: Physical Therapy

## 2021-06-22 ENCOUNTER — Ambulatory Visit (HOSPITAL_COMMUNITY): Payer: 59 | Admitting: Physical Therapy

## 2021-06-22 ENCOUNTER — Other Ambulatory Visit: Payer: Self-pay

## 2021-06-22 DIAGNOSIS — G8929 Other chronic pain: Secondary | ICD-10-CM

## 2021-06-22 DIAGNOSIS — M6281 Muscle weakness (generalized): Secondary | ICD-10-CM

## 2021-06-22 DIAGNOSIS — M5442 Lumbago with sciatica, left side: Secondary | ICD-10-CM | POA: Diagnosis not present

## 2021-06-22 DIAGNOSIS — R262 Difficulty in walking, not elsewhere classified: Secondary | ICD-10-CM

## 2021-06-22 NOTE — Therapy (Signed)
[facility] [address] [place], [place], [address] Phone: [phone]   Fax:  [phone]  Physical Therapy Treatment  Patient Details  Name: [name] MRN: [id number] Date of Birth: [birth year] Referring Provider (PT): Newman Pies, MD   Encounter Date: 06/22/2021   PT End of Session - 06/22/21 1659     Visit Number 8    Number of Visits 12    Date for PT Re-Evaluation 06/24/21    Authorization Type [employer]    Progress Note Due on Visit 10    PT Start Time 1650    PT Stop Time 1732    PT Time Calculation (min) 42 min    Activity Tolerance Patient tolerated treatment well    Behavior During Therapy [facility] for tasks assessed/performed             Past Medical History:  Diagnosis Date   Abrasion of right index finger 11/20/2012   Arthritis    left shoulder - to have surgery 12/12/2012   Cellulitis of left lower limb    Dental crown present    Deviated nasal septum 11/2012   Essential (primary) hypertension    Factor V deficiency ([place])    states never had a problem   Fatty (change of) liver, not elsewhere classified    Fatty liver    Hypertension    under control with meds., has been on med. x 5 yr.   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   Obesity, unspecified    Pain in left knee    Pain in right foot    Pain in right shoulder    Sebaceous cyst    Spontaneous rupture of other tendons, right upper arm    Tinnitus, right ear    Unilateral primary osteoarthritis, left knee     Past Surgical History:  Procedure Laterality Date   bicep tendon surgery     CYST EXCISION     wrist and back (same surgery)   INGUINAL HERNIA REPAIR Right    left shoulder surgery     NASAL SEPTOPLASTY W/ TURBINOPLASTY Bilateral 11/27/2012   Procedure: NASAL SEPTOPLASTY WITH BILATERAL TURBINATE RESECTION;  Surgeon: Ascencion Dike, MD;  Location: [place];  Service: ENT;  Laterality: Bilateral;   WISDOM  TOOTH EXTRACTION      There were no vitals filed for this visit.   Subjective Assessment - 06/22/21 1657     Subjective Patient says he was a little sore after last time. He felt some in his lower back on Friday but only lasted about a day. Otherwise doing well. No pain currently.    Currently in Pain? No/denies                               [facility] Adult PT Treatment/Exercise - 06/22/21 0001       Lumbar Exercises: Stretches   Piriformis Stretch 2 reps;30 seconds      Lumbar Exercises: Machines for Strengthening   Leg Press 6pl, 8 pl x 10 each    Other Lumbar Machine Exercise machine deadlifts 5pl, 8pl, 12 pl x 10 each      Lumbar Exercises: Standing   Other Standing Lumbar Exercises GTB palloff press x15 each, palloff walkout GTB x 10 each, GTB lifts and chops x 15 each    Other Standing Lumbar Exercises functional squats x10      Lumbar  Exercises: Supine   Dead Bug 20 reps      Lumbar Exercises: Prone   Other Prone Lumbar Exercises prone press ups 2 x 10    Other Prone Lumbar Exercises plank series, side/ side/ front 2 x 20" each      Lumbar Exercises: Quadruped   Other Quadruped Lumbar Exercises birddogs x10                       PT Short Term Goals - 05/13/21 1609       PT SHORT TERM GOAL #1   Title Patient will be able to lay on back without pain    Time 3    Period Weeks    Status New    Target Date 06/03/21      PT SHORT TERM GOAL #2   Title Patient will report at least 50% improvement in overall symptoms and/or function to demonstrate improved functional mobility    Time 3    Period Weeks    Status New    Target Date 06/03/21      PT SHORT TERM GOAL #3   Title Patient will be independent in self management strategies to improve quality of life and functional outcomes.    Time 3    Period Weeks    Status New    Target Date 06/03/21               PT Long Term Goals - 05/13/21 1610       PT LONG TERM GOAL #1    Title Patient will report at least 75% improvement in overall symptoms and/or function to demonstrate improved functional mobility    Time 6    Period Weeks    Status New    Target Date 06/24/21      PT LONG TERM GOAL #2   Title Patient will be able to maintain posterior pelvic tilt with bent knee leg lowering to table to demonstrate improved core strength    Time 6    Period Weeks    Status New    Target Date 06/24/21      PT LONG TERM GOAL #3   Title Patient will meet predicted FOTO score to demonstrate improved overall function.    Time 6    Period Weeks    Status New    Target Date 06/24/21                   Plan - 06/22/21 1737     Clinical Impression Statement Patient tolerated session well today. Introduced band lifts and chops for core strength progression, as well as increased hold times with plank series. Added machine deadlifts for lifting mechanics. Patient performed well overall, though did require verbal cues for arm positioning and to avoid flexed elbows. Patient progressing well toward therapy goals. Will reassess next visit and DC to HEP if all goals met.    Personal Factors and Comorbidities Comorbidity 1    Comorbidities hx of back and knee pain    Examination-Activity Limitations Stand;Transfers;Locomotion Level;Bend;Sit    Examination-Participation Restrictions Driving;Occupation    Stability/Clinical Decision Making Stable/Uncomplicated    Rehab Potential Good    PT Frequency 2x / week    PT Duration 6 weeks    PT Treatment/Interventions ADLs/Self Care Home Management;Cryotherapy;Electrical Stimulation;Traction;Moist Heat;Therapeutic exercise;Therapeutic activities;Manual techniques;Neuromuscular re-education;Patient/family education;Joint Manipulations;Spinal Manipulations;Dry needling    PT Next Visit Plan Reassess next visit    PT Home Exercise Plan prone press  ups, knee flexion prone 10/6 piriformis stretch, sidelying hip abduction, HS stretch,  prone hip extension; 10/24 breathing long exhale, left ext/SB in standing, positional relief 10/27 pelvic tilts 11/7 side planks 11/9 band sidestepping    Consulted and Agree with Plan of Care Patient             Patient will benefit from skilled therapeutic intervention in order to improve the following deficits and impairments:  Pain, Decreased strength, Decreased activity tolerance, Decreased mobility, Decreased range of motion, Difficulty walking, Postural dysfunction, Hypomobility  Visit Diagnosis: Chronic bilateral low back pain with bilateral sciatica  Difficulty in walking, not elsewhere classified  Muscle weakness (generalized)     Problem List Patient Active Problem List   Diagnosis Date Noted   Hypertension 06/27/2019   Personal history of colonic polyps 06/05/2019   Fatty liver    Biceps tendon tear 08/19/2014   Elbow pain 04/30/2014   5:45 PM, 06/22/21 Josue Hector PT DPT  Physical Therapist with [facility]  [phone] [place], [place], [address] Phone: [phone]   Fax:  [phone]  Name: Christopher Lawson MRN: [id number] Date of Birth: Aug 11, 1970

## 2021-06-24 ENCOUNTER — Encounter (HOSPITAL_COMMUNITY): Payer: Self-pay | Admitting: Physical Therapy

## 2021-06-24 ENCOUNTER — Other Ambulatory Visit: Payer: Self-pay

## 2021-06-24 ENCOUNTER — Ambulatory Visit (HOSPITAL_COMMUNITY): Payer: 59 | Admitting: Physical Therapy

## 2021-06-24 DIAGNOSIS — M5442 Lumbago with sciatica, left side: Secondary | ICD-10-CM

## 2021-06-24 DIAGNOSIS — M6281 Muscle weakness (generalized): Secondary | ICD-10-CM

## 2021-06-24 DIAGNOSIS — R262 Difficulty in walking, not elsewhere classified: Secondary | ICD-10-CM

## 2021-06-24 DIAGNOSIS — G8929 Other chronic pain: Secondary | ICD-10-CM

## 2021-06-24 NOTE — Therapy (Signed)
[place], [place], [address] Phone: [phone]   Fax:  [phone]  Physical Therapy Treatment  Patient Details  Name: Christopher Lawson MRN: [id number] Date of Birth: [birth year] Referring Provider (PT): Newman Pies, MD  PHYSICAL THERAPY DISCHARGE SUMMARY  Visits from Start of Care: 9  Current functional level related to goals / functional outcomes: See below    Remaining deficits: See below    Education / Equipment: See assessment    Patient agrees to discharge. Patient goals were met. Patient is being discharged due to meeting the stated rehab goals.  Encounter Date: 06/24/2021   PT End of Session - 06/24/21 1654     Visit Number 9    Number of Visits 12    Date for PT Re-Evaluation 06/24/21    Authorization Type [employer]    Progress Note Due on Visit 10    PT Start Time 1648    PT Stop Time 1723    PT Time Calculation (min) 35 min    Activity Tolerance Patient tolerated treatment well    Behavior During Therapy [facility] for tasks assessed/performed             Past Medical History:  Diagnosis Date   Abrasion of right index finger 11/20/2012   Arthritis    left shoulder - to have surgery 12/12/2012   Cellulitis of left lower limb    Dental crown present    Deviated nasal septum 11/2012   Essential (primary) hypertension    Factor V deficiency ([place])    states never had a problem   Fatty (change of) liver, not elsewhere classified    Fatty liver    Hypertension    under control with meds., has been on med. x 5 yr.   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   Obesity, unspecified    Pain in left knee    Pain in right foot    Pain in right shoulder    Sebaceous cyst    Spontaneous rupture of other tendons, right upper arm    Tinnitus, right ear    Unilateral primary osteoarthritis, left knee     Past Surgical History:  Procedure Laterality Date   bicep tendon surgery      CYST EXCISION     wrist and back (same surgery)   INGUINAL HERNIA REPAIR Right    left shoulder surgery     NASAL SEPTOPLASTY W/ TURBINOPLASTY Bilateral 11/27/2012   Procedure: NASAL SEPTOPLASTY WITH BILATERAL TURBINATE RESECTION;  Surgeon: Ascencion Dike, MD;  Location: [place];  Service: ENT;  Laterality: Bilateral;   WISDOM TOOTH EXTRACTION      There were no vitals filed for this visit.   Subjective Assessment - 06/24/21 1651     Subjective Patient says he is doing well. Pain is periodic. Stiff some today from issues at work, but no pain. Reports 90% overall improvement since starting therapy.    Currently in Pain? No/denies                [facility] PT Assessment - 06/24/21 0001       Assessment   Medical Diagnosis LBP    Referring Provider (PT) Newman Pies, MD    Prior Therapy yes      Precautions   Precautions None      Restrictions   Weight Bearing Restrictions No      Prior Function   Level of Independence  Independent      Cognition   Overall Cognitive Status Within Functional Limits for tasks assessed      Observation/Other Assessments   Focus on Therapeutic Outcomes (FOTO)  77% function   was 58%     AROM   Lumbar Flexion WNL   was 50% limited   Lumbar Extension WNL   was 25% limited   Lumbar - Right Side Bend WNL   was 50% limited   Lumbar - Left Side Bend WNL   was 75% limited     Strength   Right Hip Flexion 5/5    Right Hip Extension 4+/5    Right Hip ABduction 5/5    Left Hip Flexion 5/5    Left Hip Extension 5/5    Left Hip ABduction 5/5    Right Knee Extension 5/5    Left Knee Extension 5/5                                      PT Short Term Goals - 06/24/21 1716       PT SHORT TERM GOAL #1   Title Patient will be able to lay on back without pain    Time 3    Period Weeks    Status Achieved    Target Date 06/03/21      PT SHORT TERM GOAL #2   Title Patient will report at least 50%  improvement in overall symptoms and/or function to demonstrate improved functional mobility    Baseline Reports 90%    Time 3    Period Weeks    Status Achieved    Target Date 06/03/21      PT SHORT TERM GOAL #3   Title Patient will be independent in self management strategies to improve quality of life and functional outcomes.    Time 3    Period Weeks    Status Achieved    Target Date 06/03/21               PT Long Term Goals - 06/24/21 1717       PT LONG TERM GOAL #1   Title Patient will report at least 75% improvement in overall symptoms and/or function to demonstrate improved functional mobility    Baseline Reports 90%    Time 6    Period Weeks    Status Achieved      PT LONG TERM GOAL #2   Title Patient will be able to maintain posterior pelvic tilt with bent knee leg lowering to table to demonstrate improved core strength    Time 6    Period Weeks    Status Achieved      PT LONG TERM GOAL #3   Title Patient will meet predicted FOTO score to demonstrate improved overall function.    Time 6    Period Weeks    Status Achieved                   Plan - 06/24/21 1820     Clinical Impression Statement Patient has made very good progress toward therapy goals. Patient shows significant improvement in MMTs and pain free lumbar AROM as well as subjective reports. Reviewed HEP and answered all patient questions. Patient being DC today with all therapy goals met. Encouraged patient to follow up with therapy services with any further questions or concerns.    Personal Factors and Comorbidities Comorbidity  1    Comorbidities hx of back and knee pain    Examination-Activity Limitations Stand;Transfers;Locomotion Level;Bend;Sit    Examination-Participation Restrictions Driving;Occupation    Stability/Clinical Decision Making Stable/Uncomplicated    Rehab Potential Good    PT Treatment/Interventions ADLs/Self Care Home Management;Cryotherapy;Electrical  Stimulation;Traction;Moist Heat;Therapeutic exercise;Therapeutic activities;Manual techniques;Neuromuscular re-education;Patient/family education;Joint Manipulations;Spinal Manipulations;Dry needling    PT Next Visit Plan DC to HEP    PT Home Exercise Plan prone press ups, knee flexion prone 10/6 piriformis stretch, sidelying hip abduction, HS stretch, prone hip extension; 10/24 breathing long exhale, left ext/SB in standing, positional relief 10/27 pelvic tilts 11/7 side planks 11/9 band sidestepping    Consulted and Agree with Plan of Care Patient             Patient will benefit from skilled therapeutic intervention in order to improve the following deficits and impairments:  Pain, Decreased strength, Decreased activity tolerance, Decreased mobility, Decreased range of motion, Difficulty walking, Postural dysfunction, Hypomobility  Visit Diagnosis: Chronic bilateral low back pain with bilateral sciatica  Difficulty in walking, not elsewhere classified  Muscle weakness (generalized)     Problem List Patient Active Problem List   Diagnosis Date Noted   Hypertension 06/27/2019   Personal history of colonic polyps 06/05/2019   Fatty liver    Biceps tendon tear 08/19/2014   Elbow pain 04/30/2014   6:24 PM, 06/24/21 Josue Hector PT DPT  Physical Therapist with [facility]  [phone] [place], [place], [address] Phone: [phone]   Fax:  [phone]  Name: Christopher Lawson MRN: [id number] Date of Birth: 09/26/1970

## 2021-06-29 ENCOUNTER — Ambulatory Visit (HOSPITAL_COMMUNITY): Payer: 59 | Admitting: Physical Therapy

## 2021-07-01 ENCOUNTER — Ambulatory Visit (HOSPITAL_COMMUNITY): Payer: 59 | Admitting: Physical Therapy

## 2021-07-06 ENCOUNTER — Ambulatory Visit (HOSPITAL_COMMUNITY): Payer: 59 | Admitting: Physical Therapy

## 2021-07-08 ENCOUNTER — Ambulatory Visit (HOSPITAL_COMMUNITY): Payer: 59 | Admitting: Physical Therapy

## 2021-07-13 ENCOUNTER — Ambulatory Visit (HOSPITAL_COMMUNITY): Payer: 59 | Admitting: Physical Therapy

## 2021-07-16 ENCOUNTER — Ambulatory Visit (HOSPITAL_COMMUNITY): Payer: 59 | Admitting: Physical Therapy

## 2021-08-27 ENCOUNTER — Ambulatory Visit: Payer: 59 | Admitting: Family Medicine

## 2021-08-27 ENCOUNTER — Other Ambulatory Visit: Payer: Self-pay

## 2021-08-27 ENCOUNTER — Encounter: Payer: Self-pay | Admitting: Family Medicine

## 2021-08-27 VITALS — BP 138/78 | HR 82 | Temp 98.5°F | Resp 18 | Ht 69.5 in | Wt 234.0 lb

## 2021-08-27 DIAGNOSIS — M79672 Pain in left foot: Secondary | ICD-10-CM

## 2021-08-27 MED ORDER — PREDNISONE 20 MG PO TABS: ORAL_TABLET | ORAL | 0 refills | Status: DC

## 2021-08-27 NOTE — Progress Notes (Signed)
Subjective:    Patient ID: Christopher Lawson, male    DOB: 04-Feb-1971, 51 y.o.   MRN: [id number]  HPI About 1 week ago, the patient developed severe pain in his dorsal left midfoot specifically over the proximal second third and fourth metatarsals.  He denies any falls or injuries.  The pain began suddenly and was severe.  He could not even bear weight on his foot.  He denies any erythema or warmth.  Today on visual inspection, there is no bruising.  There is slight swelling in the area but there is no erythema or warmth.  He states the pain has improved dramatically.  He can now put some weight on his foot.  Past Medical History:  Diagnosis Date   Abrasion of right index finger 11/20/2012   Arthritis    left shoulder - to have surgery 12/12/2012   Cellulitis of left lower limb    Dental crown present    Deviated nasal septum 11/2012   Essential (primary) hypertension    Factor V deficiency (HCC)    states never had a problem   Fatty (change of) liver, not elsewhere classified    Fatty liver    Hypertension    under control with meds., has been on med. x 5 yr.   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   Obesity, unspecified    Pain in left knee    Pain in right foot    Pain in right shoulder    Sebaceous cyst    Spontaneous rupture of other tendons, right upper arm    Tinnitus, right ear    Unilateral primary osteoarthritis, left knee    Past Surgical History:  Procedure Laterality Date   bicep tendon surgery     CYST EXCISION     wrist and back (same surgery)   INGUINAL HERNIA REPAIR Right    left shoulder surgery     NASAL SEPTOPLASTY W/ TURBINOPLASTY Bilateral 11/27/2012   Procedure: NASAL SEPTOPLASTY WITH BILATERAL TURBINATE RESECTION;  Surgeon: Darletta Moll, MD;  Location: [facility];  Service: ENT;  Laterality: Bilateral;   WISDOM TOOTH EXTRACTION     Current Outpatient Medications on File Prior to Visit  Medication Sig Dispense Refill   amLODipine  (NORVASC) 10 MG tablet TAKE (1) TABLET BY MOUTH ONCE DAILY. 90 tablet 1   enalapril (VASOTEC) 10 MG tablet Take 2 tablets (20 mg total) by mouth daily. 180 tablet 1   ibuprofen (ADVIL,MOTRIN) 200 MG tablet Take 200 mg by mouth every 6 (six) hours as needed.     naproxen (NAPROSYN) 500 MG tablet Take 1 tablet (500 mg total) by mouth 2 (two) times daily with a meal. 60 tablet 5   No current facility-administered medications on file prior to visit.   Allergies  Allergen Reactions   Other    Social History   Socioeconomic History   Marital status: Married    Spouse name: Not on file   Number of children: Not on file   Years of education: Not on file   Highest education level: Not on file  Occupational History   Not on file  Tobacco Use   Smoking status: Never   Smokeless tobacco: Never  Substance and Sexual Activity   Alcohol use: Yes    Comment: occasionally   Drug use: No   Sexual activity: Not on file  Other Topics Concern   Not on file  Social History Narrative   Not on file  Social Determinants of Health   Financial Resource Strain: Not on file  Food Insecurity: Not on file  Transportation Needs: Not on file  Physical Activity: Not on file  Stress: Not on file  Social Connections: Not on file  Intimate Partner Violence: Not on file      Review of Systems  All other systems reviewed and are negative.     Objective:   Physical Exam Vitals reviewed.  Constitutional:      Appearance: Normal appearance. He is normal weight.  Cardiovascular:     Rate and Rhythm: Normal rate and regular rhythm.     Heart sounds: Normal heart sounds. No murmur heard.   No friction rub. No gallop.  Pulmonary:     Effort: Pulmonary effort is normal. No respiratory distress.     Breath sounds: Normal breath sounds. No wheezing, rhonchi or rales.  Musculoskeletal:        General: Swelling and tenderness present. No deformity or signs of injury.     Cervical back: Normal range of  motion.     Left foot: Normal range of motion. No deformity.       Feet:  Lymphadenopathy:     Cervical: No cervical adenopathy.  Neurological:     Mental Status: He is alert.          Assessment & Plan:  Pain of left midfoot - Plan: DG Foot Complete Left Heart by obtaining an x-ray of the left foot.  I believe the patient may have sprained his foot versus a gout exacerbation.  Begin prednisone taper pack and wait for the results of the x-ray.  I do not feel that the patient fractured his foot based on story.

## 2021-08-31 ENCOUNTER — Other Ambulatory Visit: Payer: Self-pay

## 2021-08-31 ENCOUNTER — Ambulatory Visit (HOSPITAL_COMMUNITY)
Admission: RE | Admit: 2021-08-31 | Discharge: 2021-08-31 | Disposition: A | Discharge status: Home / Self Care | Payer: 59 | Source: Ambulatory Visit | Attending: Family Medicine | Admitting: Family Medicine

## 2021-08-31 ENCOUNTER — Encounter: Payer: Self-pay | Admitting: Family Medicine

## 2021-08-31 DIAGNOSIS — S92325A Nondisplaced fracture of second metatarsal bone, left foot, initial encounter for closed fracture: Secondary | ICD-10-CM

## 2021-08-31 DIAGNOSIS — M79672 Pain in left foot: Secondary | ICD-10-CM | POA: Insufficient documentation

## 2021-09-01 ENCOUNTER — Other Ambulatory Visit: Payer: Self-pay

## 2021-09-01 DIAGNOSIS — M79672 Pain in left foot: Secondary | ICD-10-CM

## 2021-09-01 DIAGNOSIS — S92325D Nondisplaced fracture of second metatarsal bone, left foot, subsequent encounter for fracture with routine healing: Secondary | ICD-10-CM

## 2021-09-01 MED ORDER — POST-OP SHOE/SOFT TOP MEN MISC: 1.0000 | 0 refills | Status: DC

## 2021-09-22 ENCOUNTER — Other Ambulatory Visit: Payer: Self-pay

## 2021-09-22 ENCOUNTER — Ambulatory Visit (HOSPITAL_COMMUNITY)
Admission: RE | Admit: 2021-09-22 | Discharge: 2021-09-22 | Disposition: A | Discharge status: Home / Self Care | Payer: 59 | Source: Ambulatory Visit | Attending: Family Medicine | Admitting: Family Medicine

## 2021-09-22 DIAGNOSIS — S92325D Nondisplaced fracture of second metatarsal bone, left foot, subsequent encounter for fracture with routine healing: Secondary | ICD-10-CM | POA: Insufficient documentation

## 2021-09-22 DIAGNOSIS — M79672 Pain in left foot: Secondary | ICD-10-CM | POA: Insufficient documentation

## 2021-09-24 ENCOUNTER — Encounter: Payer: Self-pay | Admitting: Family Medicine

## 2021-11-17 ENCOUNTER — Ambulatory Visit: Payer: 59 | Admitting: Family Medicine

## 2021-11-17 VITALS — BP 134/72 | HR 71 | Temp 97.1°F | Ht 69.0 in | Wt 234.6 lb

## 2021-11-17 DIAGNOSIS — R5383 Other fatigue: Secondary | ICD-10-CM | POA: Diagnosis not present

## 2021-11-17 DIAGNOSIS — N200 Calculus of kidney: Secondary | ICD-10-CM

## 2021-11-17 NOTE — Progress Notes (Signed)
? ?Subjective:  ? ? Patient ID: Christopher Lawson, male    DOB: 11-29-1970, 51 y.o.   MRN: [id number] ? ?HPI ?Patient is a very pleasant 51 year old Caucasian gentleman who presents today complaining of fatigue and left-sided back pain.  He states that for no reason last week he developed intense pain in the left side of his back.  There were no exacerbating or alleviating factors.  It is constant and does not move.  He thought that he just pulled a muscle however it prompted him to schedule an office visit for today.  He was also having fatigue and lack of energy.  This morning he went to urinate and he passed a small black stone that is approximately 3 mm in diameter.  He took a photograph of it and showed me in the total.  It appears to be a kidney stone.  He states that the pain in his back is totally resolved.  However he kept the appointment because his wife was concerned based on his fatigue.  He denies any fevers chills chest pain shortness of breath nausea vomiting diarrhea.  He denies any dysuria or hematuria.  He states that since he passed a kidney stone, he is feeling much better. ? ?Past Medical History:  ?Diagnosis Date  ? Abrasion of right index finger 11/20/2012  ? Arthritis   ? left shoulder - to have surgery 12/12/2012  ? Cellulitis of left lower limb   ? Dental crown present   ? Deviated nasal septum 11/2012  ? Essential (primary) hypertension   ? Factor V deficiency ([facility])   ? states never had a problem  ? Fatty (change of) liver, not elsewhere classified   ? Fatty liver   ? Hypertension   ? under control with meds., has been on med. x 5 yr.  ? 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/2012  ? bilateral  ? Obesity, unspecified   ? Pain in left knee   ? Pain in right foot   ? Pain in right shoulder   ? Sebaceous cyst   ? Spontaneous rupture of other tendons, right upper arm   ? Tinnitus, right ear   ? Unilateral primary osteoarthritis, left knee   ? ?Past Surgical History:  ?Procedure Laterality Date  ? bicep tendon  surgery    ? CYST EXCISION    ? wrist and back (same surgery)  ? INGUINAL HERNIA REPAIR Right   ? left shoulder surgery    ? NASAL SEPTOPLASTY W/ TURBINOPLASTY Bilateral 11/27/2012  ? Procedure: NASAL SEPTOPLASTY WITH BILATERAL TURBINATE RESECTION;  Surgeon: Ascencion Dike, MD;  Location: [facility];  Service: ENT;  Laterality: Bilateral;  ? WISDOM TOOTH EXTRACTION    ? ?Current Outpatient Medications on File Prior to Visit  ?Medication Sig Dispense Refill  ? amLODipine (NORVASC) 10 MG tablet TAKE (1) TABLET BY MOUTH ONCE DAILY. 90 tablet 1  ? Elastic Bandages & Supports (POST-OP SHOE/SOFT TOP MEN) MISC 1 each by Does not apply route as directed. 1 each 0  ? enalapril (VASOTEC) 10 MG tablet Take 2 tablets (20 mg total) by mouth daily. 180 tablet 1  ? ibuprofen (ADVIL,MOTRIN) 200 MG tablet Take 200 mg by mouth every 6 (six) hours as needed.    ? naproxen (NAPROSYN) 500 MG tablet Take 1 tablet (500 mg total) by mouth 2 (two) times daily with a meal. 60 tablet 5  ? predniSONE (DELTASONE) 20 MG tablet 3 tabs poqday 1-2, 2 tabs poqday 3-4, 1 tab  poqday 5-6 12 tablet 0  ? ?No current facility-administered medications on file prior to visit.  ? ?Allergies  ?Allergen Reactions  ? Other   ? ?Social History  ? ?Socioeconomic History  ? Marital status: Married  ?  Spouse name: Not on file  ? Number of children: Not on file  ? Years of education: Not on file  ? Highest education level: Not on file  ?Occupational History  ? Not on file  ?Tobacco Use  ? Smoking status: Never  ? Smokeless tobacco: Never  ?Substance and Sexual Activity  ? Alcohol use: Yes  ?  Comment: occasionally  ? Drug use: No  ? Sexual activity: Not on file  ?Other Topics Concern  ? Not on file  ?Social History Narrative  ? Not on file  ? ?Social Determinants of Health  ? ?Financial Resource Strain: Not on file  ?Food Insecurity: Not on file  ?Transportation Needs: Not on file  ?Physical Activity: Not on file  ?Stress: Not on file  ?Social  Connections: Not on file  ?Intimate Partner Violence: Not on file  ? ? ? ? ?Review of Systems  ?All other systems reviewed and are negative. ? ?   ?Objective:  ? Physical Exam ?Vitals reviewed.  ?Constitutional:   ?   Appearance: Normal appearance. He is normal weight.  ?HENT:  ?   Right Ear: Tympanic membrane and ear canal normal.  ?   Left Ear: Tympanic membrane and ear canal normal.  ?   Nose: Nose normal. No congestion or rhinorrhea.  ?   Mouth/Throat:  ?   Mouth: Mucous membranes are moist.  ?   Pharynx: Oropharynx is clear. No oropharyngeal exudate or posterior oropharyngeal erythema.  ?Eyes:  ?   Conjunctiva/sclera: Conjunctivae normal.  ?   Pupils: Pupils are equal, round, and reactive to light.  ?Cardiovascular:  ?   Rate and Rhythm: Normal rate and regular rhythm.  ?   Heart sounds: Normal heart sounds. No murmur heard. ?  No friction rub. No gallop.  ?Pulmonary:  ?   Effort: Pulmonary effort is normal. No respiratory distress.  ?   Breath sounds: Normal breath sounds. No wheezing, rhonchi or rales.  ?Abdominal:  ?   General: Abdomen is flat. Bowel sounds are normal. There is no distension.  ?   Palpations: Abdomen is soft.  ?   Tenderness: There is no abdominal tenderness. There is no right CVA tenderness, left CVA tenderness, guarding or rebound.  ?Musculoskeletal:  ?   Cervical back: Normal range of motion.  ?Lymphadenopathy:  ?   Cervical: No cervical adenopathy.  ?Skin: ?   Coloration: Skin is not jaundiced.  ?   Findings: No erythema or rash.  ?Neurological:  ?   Mental Status: He is alert.  ? ? ? ? ? ?   ?Assessment & Plan:  ?Fatigue, unspecified type - Plan: CBC with Differential/Platelet, COMPLETE METABOLIC PANEL WITH GFR, TSH ? ?Kidney stone ?I believe the pain in the patient's left flank is likely due to a kidney stone.  I believe the fatigue is likely multifactorial and due to possibly to pain from the kidney stone coupled with caring for his father who has Parkinson's disease in his mother who  was in the hospital last week.  I believe it is multifactorial is now starting to improve however we will check baseline lab work including a CBC CMP TSH rule out any other contributing factors. ?

## 2021-11-18 LAB — COMPLETE METABOLIC PANEL WITH GFR
AG Ratio: 1.8 (calc) (ref 1.0–2.5)
ALT: 52 U/L — ABNORMAL HIGH (ref 9–46)
AST: 27 U/L (ref 10–35)
Albumin: 4.6 g/dL (ref 3.6–5.1)
Alkaline phosphatase (APISO): 100 U/L (ref 35–144)
BUN: 14 mg/dL (ref 7–25)
CO2: 26 mmol/L (ref 20–32)
Calcium: 9.7 mg/dL (ref 8.6–10.3)
Chloride: 107 mmol/L (ref 98–110)
Creat: 0.96 mg/dL (ref 0.70–1.30)
Globulin: 2.5 g/dL (calc) (ref 1.9–3.7)
Glucose, Bld: 80 mg/dL (ref 65–99)
Potassium: 4.7 mmol/L (ref 3.5–5.3)
Sodium: 142 mmol/L (ref 135–146)
Total Bilirubin: 0.5 mg/dL (ref 0.2–1.2)
Total Protein: 7.1 g/dL (ref 6.1–8.1)
eGFR: 96 mL/min/{1.73_m2} (ref >=60)

## 2021-11-18 LAB — CBC WITH DIFFERENTIAL/PLATELET
Absolute Monocytes: 593 cells/uL (ref 200–950)
Basophils Absolute: 38 cells/uL (ref 0–200)
Basophils Relative: 0.5 %
Eosinophils Absolute: 403 cells/uL (ref 15–500)
Eosinophils Relative: 5.3 %
HCT: 44.7 % (ref 38.5–50.0)
Hemoglobin: 15.1 g/dL (ref 13.2–17.1)
Lymphs Abs: 2022 cells/uL (ref 850–3900)
MCH: 31.1 pg (ref 27.0–33.0)
MCHC: 33.8 g/dL (ref 32.0–36.0)
MCV: 92 fL (ref 80.0–100.0)
MPV: 10.5 fL (ref 7.5–12.5)
Monocytes Relative: 7.8 %
Neutro Abs: 4545 cells/uL (ref 1500–7800)
Neutrophils Relative %: 59.8 %
Platelets: 239 10*3/uL (ref 140–400)
RBC: 4.86 10*6/uL (ref 4.20–5.80)
RDW: 12.4 % (ref 11.0–15.0)
Total Lymphocyte: 26.6 %
WBC: 7.6 10*3/uL (ref 3.8–10.8)

## 2021-11-18 LAB — TSH: TSH: 1.37 mIU/L (ref 0.40–4.50)

## 2021-12-03 ENCOUNTER — Other Ambulatory Visit: Payer: Self-pay | Admitting: Family Medicine

## 2021-12-03 ENCOUNTER — Encounter: Payer: Self-pay | Admitting: Family Medicine

## 2021-12-03 DIAGNOSIS — K429 Umbilical hernia without obstruction or gangrene: Secondary | ICD-10-CM

## 2021-12-09 ENCOUNTER — Other Ambulatory Visit: Payer: Self-pay | Admitting: Family Medicine

## 2021-12-10 NOTE — Telephone Encounter (Signed)
Requested Prescriptions  ?Pending Prescriptions Disp Refills  ?? amLODipine (NORVASC) 10 MG tablet [Pharmacy Med Name: amLODIPine Besylate 10 MG Oral Tablet] 90 tablet 0  ?  Sig: TAKE TABLET BY MOUTH ONCE  DAILY  ?  ? Cardiovascular: Calcium Channel Blockers 2 Passed - 12/09/2021  3:19 PM  ?  ?  Passed - Last BP in normal range  ?  BP Readings from Last 1 Encounters:  ?11/17/21 134/72  ?   ?  ?  Passed - Last Heart Rate in normal range  ?  Pulse Readings from Last 1 Encounters:  ?11/17/21 71  ?   ?  ?  Passed - Valid encounter within last 6 months  ?  Recent Outpatient Visits   ?      ? 3 weeks ago Fatigue, unspecified type  ? [facility], Priscille Heidelberg, MD  ? 3 months ago Pain of left midfoot  ? [facility], Priscille Heidelberg, MD  ? 11 months ago Postviral fatigue syndrome  ? [facility], Priscille Heidelberg, MD  ? 1 year ago Neck pain  ? [facility], Priscille Heidelberg, MD  ? 1 year ago Primary hypertension  ? [facility], Priscille Heidelberg, MD  ?  ?  ? ?  ?  ?  ?? enalapril (VASOTEC) 10 MG tablet [Pharmacy Med Name: ENALAPRIL  10MG   TAB] 180 tablet 0  ?  Sig: TAKE 2 TABLETS BY MOUTH  DAILY  ?  ? Cardiovascular:  ACE Inhibitors Passed - 12/09/2021  3:19 PM  ?  ?  Passed - Cr in normal range and within 180 days  ?  Creat  ?Date Value Ref Range Status  ?11/17/2021 0.96 0.70 - 1.30 mg/dL Final  ?   ?  ?  Passed - K in normal range and within 180 days  ?  Potassium  ?Date Value Ref Range Status  ?11/17/2021 4.7 3.5 - 5.3 mmol/L Final  ?   ?  ?  Passed - Patient is not pregnant  ?  ?  Passed - Last BP in normal range  ?  BP Readings from Last 1 Encounters:  ?11/17/21 134/72  ?   ?  ?  Passed - Valid encounter within last 6 months  ?  Recent Outpatient Visits   ?      ? 3 weeks ago Fatigue, unspecified type  ? [facility], [facility], [facility]  ? 3 months ago Pain of left midfoot  ? [facility], [facility], MD  ? 11 months ago Postviral fatigue syndrome  ? [facility], [facility], MD  ? 1 year ago Neck pain  ? [facility], [facility], MD  ? 1 year ago Primary hypertension  ? [facility], [facility], MD  ?  ?  ? ?  ?  ?  ? ? ?

## 2021-12-17 ENCOUNTER — Ambulatory Visit: Payer: 59 | Admitting: General Surgery

## 2021-12-17 ENCOUNTER — Encounter: Payer: Self-pay | Admitting: General Surgery

## 2021-12-17 VITALS — BP 129/86 | HR 67 | Temp 98.2°F | Resp 14 | Ht 69.0 in | Wt 238.0 lb

## 2021-12-17 DIAGNOSIS — K420 Umbilical hernia with obstruction, without gangrene: Secondary | ICD-10-CM | POA: Insufficient documentation

## 2021-12-17 DIAGNOSIS — K429 Umbilical hernia without obstruction or gangrene: Secondary | ICD-10-CM

## 2021-12-17 NOTE — Progress Notes (Signed)
[facility] History and Physical ? ?Reason for Referral: Umbilical hernia  ?Referring Physician:  Donita Lawson, Christopher T, MD ? ?Chief Complaint   ?New Patient (Initial Visit) ?  ? ? ?Christopher Lawson is a 51 y.o. male.  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 Christopher Lawson says it has been getting larger and Christopher Lawson was worried about it. Christopher Lawson has some minor discomfort. Christopher Lawson does have to lift and is active at work. Christopher Lawson has to spray for COVID daily antiseptic with a 15 lb back pack. Christopher Lawson says that Christopher Lawson has not had any obstructive symptoms. Christopher Lawson had an inguinal hernia in the past.  ? ?Christopher Lawson recently had his first kidney stone and had some back pain related to that which is better but has some chronic back pain. ? 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 Christopher Lawson was tested because his sister as this issue and is symptomatic.  ?  ? ?Past Medical History:  ?Diagnosis Date  ? Abrasion of right index finger 11/20/2012  ? Arthritis   ? left shoulder - to have surgery 12/12/2012  ? Cellulitis of left lower limb   ? Dental crown present   ? Deviated nasal septum 11/2012  ? Essential (primary) hypertension   ? Factor V deficiency (HCC)   ? states never had a problem  ? Fatty (change of) liver, not elsewhere classified   ? Fatty liver   ? Hypertension   ? under control with meds., has been on med. x 5 yr.  ? 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/2012  ? bilateral  ? Obesity, unspecified   ? Pain in left knee   ? Pain in right foot   ? Pain in right shoulder   ? Sebaceous cyst   ? Spontaneous rupture of other tendons, right upper arm   ? Tinnitus, right ear   ? Unilateral primary osteoarthritis, left knee   ? ? ?Past Surgical History:  ?Procedure Laterality Date  ? bicep tendon surgery    ? CYST EXCISION    ? wrist and back (same surgery)  ? INGUINAL HERNIA REPAIR Right   ? left shoulder  surgery    ? NASAL SEPTOPLASTY W/ TURBINOPLASTY Bilateral 11/27/2012  ? Procedure: NASAL SEPTOPLASTY WITH BILATERAL TURBINATE RESECTION;  Surgeon: Darletta MollSui W Teoh, MD;  Location: [facility];  Service: ENT;  Laterality: Bilateral;  ? WISDOM TOOTH EXTRACTION    ? ? ?No family history on file. ? ?Social History  ? ?Tobacco Use  ? Smoking status: Never  ? Smokeless tobacco: Never  ?Substance Use Topics  ? Alcohol use: Yes  ?  Comment: occasionally  ? Drug use: No  ? ? ?Medications: I have reviewed the patient's current medications. ?Allergies as of 12/17/2021   ? ?   Reactions  ? Other   ? ?  ? ?  ?Medication List  ?  ? ?  ? Accurate as of Dec 17, 2021  9:30 AM. If you have any questions, ask your nurse or doctor.  ?  ?  ? ?  ? ?STOP taking these medications   ? ?ibuprofen 200 MG tablet ?Commonly known as: ADVIL ?Stopped by: Christopher RoersLindsay Lawson Alaura Schippers, MD ?  ?naproxen 500 MG tablet ?Commonly known as: NAPROSYN ?Stopped by: Christopher RoersLindsay Lawson Christopher Varma, MD ?  ?Post-OP Shoe/Soft Top Men  Misc ?Stopped by: Christopher Roers, MD ?  ?predniSONE 20 MG tablet ?Commonly known as: DELTASONE ?Stopped by: Christopher Roers, MD ?  ? ?  ? ?TAKE these medications   ? ?amLODipine 10 MG tablet ?Commonly known as: NORVASC ?TAKE TABLET BY MOUTH ONCE  DAILY ?  ?enalapril 10 MG tablet ?Commonly known as: VASOTEC ?TAKE 2 TABLETS BY MOUTH  DAILY ?  ? ?  ? ? ? ?ROS:  ?A comprehensive review of systems was negative except for: Cardiovascular: positive for HTN ?Gastrointestinal: positive for abdominal pain ?Musculoskeletal: positive for back pain, neck pain, and joint pain ? ?Blood pressure 129/86, pulse 67, temperature 98.2 ?F (36.8 ?Lawson), temperature source Oral, resp. rate 14, height 5\' 9"  (1.753 m), weight 238 lb (108 kg), SpO2 98 %. ?Physical Exam ?Vitals reviewed.  ?Constitutional:   ?   Appearance: Normal appearance.  ?HENT:  ?   Head: Normocephalic.  ?   Nose: Nose normal.  ?   Mouth/Throat:  ?   Mouth: Mucous membranes are moist.  ?Eyes:  ?    Extraocular Movements: Extraocular movements intact.  ?Cardiovascular:  ?   Rate and Rhythm: Normal rate and regular rhythm.  ?Pulmonary:  ?   Breath sounds: Normal breath sounds.  ?Abdominal:  ?   General: There is no distension.  ?   Palpations: Abdomen is soft.  ?   Tenderness: There is abdominal tenderness.  ?   Hernia: A hernia is present. Hernia is present in the umbilical area.  ?   Comments: Partially reducible, unable to feel defect, tender   ?Musculoskeletal:     ?   General: Normal range of motion.  ?   Cervical back: Normal range of motion.  ?Skin: ?   General: Skin is warm.  ?Neurological:  ?   General: No focal deficit present.  ?   Mental Status: Christopher Lawson is alert.  ?Psychiatric:     ?   Mood and Affect: Mood normal.     ?   Thought Content: Thought content normal.  ? ? ?Results: ?None ? ? ?Assessment & Plan:  ?Christopher Lawson is a 51 y.o. male with an umbilical hernia. Christopher Lawson says it is getting larger and is uncomfortable with palpation. Discussed incarceration and strangulation. Discussed Christopher Lawson likely has omentum in there now and reasons to go to the ED. Christopher Lawson wants to get it fixed but needs to work it out with work. Christopher Lawson will have to lift the 15 lb back pack for the spray. Told him this could be possible after 2 weeks. But would recommend no lifting over that for 6-8 weeks total.  ? ?-We discussed hernia repair. We discussed that the risk of recurrence goes up with elevated BMI over 35, diabetes, smoking, and chronic cough.  We discussed that some surgeons refuse to repair hernias in patients with a BMI > 40 due to the risk of recurrence. We discussed that there are open and laparoscopic repair techniques, and that the decision for one technique over another is based on the size of the hernia and the prior surgeries.  We discussed that hernias that are asymptomatic, soft, and are reducible, do not have to fixed as long as the person is aware of precautions to take and reasons to go to the emergency room such as  worsening pain, nausea/vomiting, the hernia becoming hard and not reducing.  We discussed the risk of surgery including but not limited to bleeding, infection, injury to other organs, use of mesh  to reduce recurrence, and risk of recurrence even with mesh.  After the above discussion, the patient is going to proceed as soon as Christopher Lawson works it out with work.  ? ? ?All questions were answered to the satisfaction of the patient. ? ?Open umbilical hernia repair in the near future.  ? ? ? ?Christopher Roers ?12/17/2021, 9:30 AM  ? ? ? ? ? ?

## 2021-12-17 NOTE — Patient Instructions (Signed)
Umbilical Hernia, Adult  A hernia is a bulge of tissue that pushes through an opening between muscles. An umbilical hernia happens in the abdomen, near the belly button (umbilicus). The hernia may contain tissues from the small intestine, large intestine, or fatty tissue covering the intestines. Umbilical hernias in adults tend to get worse over time, and they require surgical treatment. There are different types of umbilical hernias, including: Indirect hernia. This type is located just above or below the umbilicus. It is the most common type of umbilical hernia in adults. Direct hernia. This type forms through an opening formed by the umbilicus. Reducible hernia. This type of hernia comes and goes. It may be visible only when you strain, lift something heavy, or cough. This type of hernia can be pushed back into the abdomen (reduced). Incarcerated hernia. This type traps abdominal tissue inside the hernia. This type of hernia cannot be reduced. Strangulated hernia. This type of hernia cuts off blood flow to the tissues inside the hernia. The tissues can start to die if this happens. This type of hernia requires emergency treatment. What are the causes? An umbilical hernia happens when tissue inside the abdomen presses on a weak area of the abdominal muscles. What increases the risk? You may have a greater risk of this condition if you: Are obese. Have had several pregnancies. Have a buildup of fluid inside your abdomen. Have had surgery that weakens the abdominal muscles. What are the signs or symptoms? The main symptom of this condition is a painless bulge at or near the belly button. A reducible hernia may be visible only when you strain, lift something heavy, or cough. Other symptoms may include: Dull pain. A feeling of pressure. Symptoms of a strangulated hernia may include: Pain that gets increasingly worse. Nausea and vomiting. Pain when pressing on the hernia. Skin over the hernia  becoming red or purple. Constipation. Blood in the stool. How is this diagnosed? This condition may be diagnosed based on: A physical exam. You may be asked to cough or strain while standing. These actions increase the pressure inside your abdomen and can force the hernia through the opening in your muscles. Your health care provider may try to reduce the hernia by pressing on it. Your symptoms and medical history. How is this treated? Surgery is the only treatment for an umbilical hernia. Surgery for a strangulated hernia is done as soon as possible. If you have a small hernia that is not incarcerated, you may need to lose weight before having surgery. Follow these instructions at home: Lose weight, if told by your health care provider. Do not try to push the hernia back in. Watch your hernia for any changes in color or size. Tell your health care provider if any changes occur. You may need to avoid activities that increase pressure on your hernia. Do not lift anything that is heavier than 10 lb (4.5 kg), or the limit that you are told, until your health care provider says that it is safe. Take over-the-counter and prescription medicines only as told by your health care provider. Keep all follow-up visits. This is important. Contact a health care provider if: Your hernia gets larger. Your hernia becomes painful. Get help right away if: You develop sudden, severe pain near the area of your hernia. You have pain as well as nausea or vomiting. You have pain and the skin over your hernia changes color. You develop a fever or chills. Summary A hernia is a bulge of   tissue that pushes through an opening between muscles. An umbilical hernia happens near the belly button. ?Surgery is the only treatment for an umbilical hernia. ?Do not try to push your hernia back in. ?Keep all follow-up visits. This is important. ?This information is not intended to replace advice given to you by your health care  provider. Make sure you discuss any questions you have with your health care provider. ?Open Hernia Repair, Adult ?Open hernia repair is a surgical procedure to fix a hernia. A hernia occurs when an internal organ or tissue pushes through a weak spot in the muscles along the wall of the abdomen. Hernias commonly occur in the groin and around the belly button. ?Most hernias tend to get worse over time. Often, surgery is done to prevent the hernia from becoming bigger, uncomfortable, or an emergency. Emergency surgery may be needed if contents of the abdomen get stuck in the opening (incarcerated hernia) or if the blood supply gets cut off (strangulated hernia). In an open repair, an incision is made in the abdomen to perform the surgery. ?Tell a health care provider about: ?Any allergies you have. ?All medicines you are taking, including vitamins, herbs, eye drops, creams, and over-the-counter medicines. ?Any problems you or family members have had with anesthetic medicines. ?Any blood or bone disorders you have. ?Any surgeries you have had. ?Any medical conditions you have, including any recent cold or flu (influenza)symptoms. ?Whether you are pregnant or may be pregnant. ?What are the risks? ?Generally, this is a safe procedure. However, problems may occur, including: ?Long-lasting (chronic) pain. ?Bleeding. ?Infection. ?Damage to the testicles. This can cause shrinking or swelling. ?Damage to nearby structures or organs, including the bladder, blood vessels, intestines, or nerves near the hernia. ?Blood clots. ?Trouble passing urine. ?Return of the hernia. ?What happens before the procedure? ?Medicines ?Ask your health care provider about: ?Changing or stopping your regular medicines. This is especially important if you are taking diabetes medicines or blood thinners. ?Taking medicines such as aspirin and ibuprofen. These medicines can thin your blood. Do not take these medicines unless your health care provider  tells you to take them. ?Taking over-the-counter medicines, vitamins, herbs, and supplements. ?Surgery safety ?Ask your health care provider: ?How your surgery site will be marked. ?What steps will be taken to help prevent infection. These steps may include: ?Removing hair at the surgery site. ?Washing skin with a germ-killing soap. ?Receiving antibiotic medicine. ?General instructions ?You may have an exam or testing, such as blood tests or imaging studies. ?Do not use any products that contain nicotine or tobacco for at least 4 weeks before the procedure. These products include cigarettes, chewing tobacco, and vaping devices, such as e-cigarettes. If you need help quitting, ask your health care provider. ?Let your health care provider know if you develop a cold or any infection before your surgery. If you get an infection before surgery, you may receive antibiotics to treat it. ?Plan to have a responsible adult take you home from the hospital or clinic. ?If you will be going home right after the procedure, plan to have a responsible adult care for you for the time you are told. This is important. ?What happens during the procedure? ? ?An IV will be inserted into one of your veins. ?You will be given one or more of the following: ?A medicine to help you relax (sedative). ?A medicine to numb the area (local anesthetic). ?A medicine to make you fall asleep (general anesthetic). ?Your surgeon will make  an incision over the hernia. ?The tissues of the hernia will be moved back into place. ?The edges of the hernia may be stitched (sutured) together. ?The opening in the abdominal muscles will be closed with stitches (sutures). Or, your surgeon will place a mesh patch made of artificial (synthetic) material over the opening. ?The incision will be closed with sutures, skin glue, or adhesive strips. ?A bandage (dressing) may be placed over the incision. ?The procedure may vary among health care providers and  hospitals. ?What happens after the procedure? ?Your blood pressure, heart rate, breathing rate, and blood oxygen level will be monitored until you leave the hospital or clinic. ?You may be given medicine for pain. ?If

## 2022-01-25 NOTE — Patient Instructions (Signed)
Christopher Lawson  01/25/2022     @PREFPERIOPPHARMACY @   Your procedure is scheduled on  01/29/2022.   Report to [facility] at  0600  A.M.   Call this number if you have problems the morning of surgery:  [phone]   Remember:  Do not eat or drink after midnight.      Take these medicines the morning of surgery with A SIP OF WATER                                         Norvasc.     Do not wear jewelry, make-up or nail polish.  Do not wear lotions, powders, or perfumes, or deodorant.  Do not shave 48 hours prior to surgery.  Men may shave face and neck.  Do not bring valuables to the hospital.  [facility] is not responsible for any belongings or valuables.  Contacts, dentures or bridgework may not be worn into surgery.  Leave your suitcase in the car.  After surgery it may be brought to your room.  For patients admitted to the hospital, discharge time will be determined by your treatment team.  Patients discharged the day of surgery will not be allowed to drive home and must have someone with them for 24 hours.    Special instructions:   DO NOT smoke tobacco or vape for 24 hours before your procedure.  Please read over the following fact sheets that you were given. Anesthesia Post-op Instructions and Care and Recovery After Surgery      Open Hernia Repair, Adult, Care After What can I expect after the procedure? After the procedure, it is common to have: Mild discomfort. Slight bruising. Mild swelling. Pain in the belly (abdomen). A small amount of blood from the cut from surgery (incision). Follow these instructions at home: Your doctor may give you more specific instructions. If you have problems, call your doctor. Medicines Take over-the-counter and prescription medicines only as told by your doctor. If told, take steps to prevent problems with pooping (constipation). You may need to: Drink enough fluid to keep your pee (urine) pale  yellow. Take medicines. You will be told what medicines to take. Eat foods that are high in fiber. These include beans, whole grains, and fresh fruits and vegetables. Limit foods that are high in fat and sugar. These include fried or sweet foods. Ask your doctor if you should avoid driving or using machines while you are taking your medicine. Incision care  Follow instructions from your doctor about how to take care of your incision. Make sure you: Wash your hands with soap and water for at least 20 seconds before and after you change your bandage (dressing). If you cannot use soap and water, use hand sanitizer. Change your bandage. Leave stitches or skin glue in place for at least 2 weeks. Leave tape strips alone unless you are told to take them off. You may trim the edges of the tape strips if they curl up. Check your incision every day for signs of infection. Check for: More redness, swelling, or pain. More fluid or blood. Warmth. Pus or a bad smell. Wear loose, soft clothing while your incision heals. Activity  Rest as told by your doctor. Do not lift anything that is heavier than 10 lb (4.5 kg), or the limit that you are told.  Do not play contact sports until your doctor says that this is safe. If you were given a sedative during your procedure, do not drive or use machines until your doctor says that it is safe. A sedative is a medicine that helps you relax. Return to your normal activities when your doctor says that it is safe. General instructions Do not take baths, swim, or use a hot tub. Ask your doctor about taking showers or sponge baths. Hold a pillow over your belly when you cough or sneeze. This helps with pain. Do not smoke or use any products that contain nicotine or tobacco. If you need help quitting, ask your doctor. Keep all follow-up visits. Contact a doctor if: You have any of these signs of infection in or around your incision: More redness, swelling, or  pain. More fluid or blood. Warmth. Pus. A bad smell. You have a fever or chills. You have blood in your poop (stool). You have not pooped (had a bowel movement) in 2-3 days. Medicine does not help your pain. Get help right away if: You have chest pain, or you are short of breath. You feel faint or light-headed. You have very bad pain. You vomit and your pain is worse. You have pain, swelling, or redness in a leg. These symptoms may be an emergency. Get help right away. Call your local emergency services (911 in the [place] Do not wait to see if the symptoms will go away. Do not drive yourself to the hospital. Summary After this procedure, it is common to have mild discomfort, slight bruising, and mild swelling. Follow instructions from your doctor about how to take care of your cut from surgery (incision). Check every day for signs of infection. Do not lift heavy objects or play contact sports until your doctor says it is safe. Return to your normal activities as told by your doctor. This information is not intended to replace advice given to you by your health care provider. Make sure you discuss any questions you have with your health care provider. Document Revised: 03/10/2020 Document Reviewed: 03/10/2020 Elsevier Patient Education  2023 Elsevier Inc. How to Use Chlorhexidine for Bathing Chlorhexidine gluconate (CHG) is a germ-killing (antiseptic) solution that is used to clean the skin. It can get rid of the bacteria that normally live on the skin and can keep them away for about 24 hours. To clean your skin with CHG, you may be given: A CHG solution to use in the shower or as part of a sponge bath. A prepackaged cloth that contains CHG. Cleaning your skin with CHG may help lower the risk for infection: While you are staying in the intensive care unit of the hospital. If you have a vascular access, such as a central line, to provide short-term or long-term access to your  veins. If you have a catheter to drain urine from your bladder. If you are on a ventilator. A ventilator is a machine that helps you breathe by moving air in and out of your lungs. After surgery. What are the risks? Risks of using CHG include: A skin reaction. Hearing loss, if CHG gets in your ears and you have a perforated eardrum. Eye injury, if CHG gets in your eyes and is not rinsed out. The CHG product catching fire. Make sure that you avoid smoking and flames after applying CHG to your skin. Do not use CHG: If you have a chlorhexidine allergy or have previously reacted to chlorhexidine. On babies younger than 2  months of age. How to use CHG solution Use CHG only as told by your health care provider, and follow the instructions on the label. Use the full amount of CHG as directed. Usually, this is one bottle. During a shower Follow these steps when using CHG solution during a shower (unless your health care provider gives you different instructions): Start the shower. Use your normal soap and shampoo to wash your face and hair. Turn off the shower or move out of the shower stream. Pour the CHG onto a clean washcloth. Do not use any type of brush or rough-edged sponge. Starting at your neck, lather your body down to your toes. Make sure you follow these instructions: If you will be having surgery, pay special attention to the part of your body where you will be having surgery. Scrub this area for at least 1 minute. Do not use CHG on your head or face. If the solution gets into your ears or eyes, rinse them well with water. Avoid your genital area. Avoid any areas of skin that have broken skin, cuts, or scrapes. Scrub your back and under your arms. Make sure to wash skin folds. Let the lather sit on your skin for 1-2 minutes or as long as told by your health care provider. Thoroughly rinse your entire body in the shower. Make sure that all body creases and crevices are rinsed  well. Dry off with a clean towel. Do not put any substances on your body afterward--such as powder, lotion, or perfume--unless you are told to do so by your health care provider. Only use lotions that are recommended by the manufacturer. Put on clean clothes or pajamas. If it is the night before your surgery, sleep in clean sheets.  During a sponge bath Follow these steps when using CHG solution during a sponge bath (unless your health care provider gives you different instructions): Use your normal soap and shampoo to wash your face and hair. Pour the CHG onto a clean washcloth. Starting at your neck, lather your body down to your toes. Make sure you follow these instructions: If you will be having surgery, pay special attention to the part of your body where you will be having surgery. Scrub this area for at least 1 minute. Do not use CHG on your head or face. If the solution gets into your ears or eyes, rinse them well with water. Avoid your genital area. Avoid any areas of skin that have broken skin, cuts, or scrapes. Scrub your back and under your arms. Make sure to wash skin folds. Let the lather sit on your skin for 1-2 minutes or as long as told by your health care provider. Using a different clean, wet washcloth, thoroughly rinse your entire body. Make sure that all body creases and crevices are rinsed well. Dry off with a clean towel. Do not put any substances on your body afterward--such as powder, lotion, or perfume--unless you are told to do so by your health care provider. Only use lotions that are recommended by the manufacturer. Put on clean clothes or pajamas. If it is the night before your surgery, sleep in clean sheets. How to use CHG prepackaged cloths Only use CHG cloths as told by your health care provider, and follow the instructions on the label. Use the CHG cloth on clean, dry skin. Do not use the CHG cloth on your head or face unless your health care provider tells  you to. When washing with the CHG cloth: Avoid  your genital area. Avoid any areas of skin that have broken skin, cuts, or scrapes. Before surgery Follow these steps when using a CHG cloth to clean before surgery (unless your health care provider gives you different instructions): Using the CHG cloth, vigorously scrub the part of your body where you will be having surgery. Scrub using a back-and-forth motion for 3 minutes. The area on your body should be completely wet with CHG when you are done scrubbing. Do not rinse. Discard the cloth and let the area air-dry. Do not put any substances on the area afterward, such as powder, lotion, or perfume. Put on clean clothes or pajamas. If it is the night before your surgery, sleep in clean sheets.  For general bathing Follow these steps when using CHG cloths for general bathing (unless your health care provider gives you different instructions). Use a separate CHG cloth for each area of your body. Make sure you wash between any folds of skin and between your fingers and toes. Wash your body in the following order, switching to a new cloth after each step: The front of your neck, shoulders, and chest. Both of your arms, under your arms, and your hands. Your stomach and groin area, avoiding the genitals. Your right leg and foot. Your left leg and foot. The back of your neck, your back, and your buttocks. Do not rinse. Discard the cloth and let the area air-dry. Do not put any substances on your body afterward--such as powder, lotion, or perfume--unless you are told to do so by your health care provider. Only use lotions that are recommended by the manufacturer. Put on clean clothes or pajamas. Contact a health care provider if: Your skin gets irritated after scrubbing. You have questions about using your solution or cloth. You swallow any chlorhexidine. Call your local poison control center ([phone] in the [place] Get help right away if: Your  eyes itch badly, or they become very red or swollen. Your skin itches badly and is red or swollen. Your hearing changes. You have trouble seeing. You have swelling or tingling in your mouth or throat. You have trouble breathing. These symptoms may represent a serious problem that is an emergency. Do not wait to see if the symptoms will go away. Get medical help right away. Call your local emergency services (911 in the [place] Do not drive yourself to the hospital. Summary Chlorhexidine gluconate (CHG) is a germ-killing (antiseptic) solution that is used to clean the skin. Cleaning your skin with CHG may help to lower your risk for infection. You may be given CHG to use for bathing. It may be in a bottle or in a prepackaged cloth to use on your skin. Carefully follow your health care provider's instructions and the instructions on the product label. Do not use CHG if you have a chlorhexidine allergy. Contact your health care provider if your skin gets irritated after scrubbing. This information is not intended to replace advice given to you by your health care provider. Make sure you discuss any questions you have with your health care provider. Document Revised: 10/06/2020 Document Reviewed: 10/06/2020 Elsevier Patient Education  2023 Elsevier Inc. General Anesthesia, Adult, Care After This sheet gives you information about how to care for yourself after your procedure. Your health care provider may also give you more specific instructions. If you have problems or questions, contact your health care provider. What can I expect after the procedure? After the procedure, the following side effects are common: Pain or  discomfort at the IV site. Nausea. Vomiting. Sore throat. Trouble concentrating. Feeling cold or chills. Feeling weak or tired. Sleepiness and fatigue. Soreness and body aches. These side effects can affect parts of the body that were not involved in surgery. Follow these  instructions at home: For the time period you were told by your health care provider:  Rest. Do not participate in activities where you could fall or become injured. Do not drive or use machinery. Do not drink alcohol. Do not take sleeping pills or medicines that cause drowsiness. Do not make important decisions or sign legal documents. Do not take care of children on your own. Eating and drinking Follow any instructions from your health care provider about eating or drinking restrictions. When you feel hungry, start by eating small amounts of foods that are soft and easy to digest (bland), such as toast. Gradually return to your regular diet. Drink enough fluid to keep your urine pale yellow. If you vomit, rehydrate by drinking water, juice, or clear broth. General instructions If you have sleep apnea, surgery and certain medicines can increase your risk for breathing problems. Follow instructions from your health care provider about wearing your sleep device: Anytime you are sleeping, including during daytime naps. While taking prescription pain medicines, sleeping medicines, or medicines that make you drowsy. Have a responsible adult stay with you for the time you are told. It is important to have someone help care for you until you are awake and alert. Return to your normal activities as told by your health care provider. Ask your health care provider what activities are safe for you. Take over-the-counter and prescription medicines only as told by your health care provider. If you smoke, do not smoke without supervision. Keep all follow-up visits as told by your health care provider. This is important. Contact a health care provider if: You have nausea or vomiting that does not get better with medicine. You cannot eat or drink without vomiting. You have pain that does not get better with medicine. You are unable to pass urine. You develop a skin rash. You have a fever. You have  redness around your IV site that gets worse. Get help right away if: You have difficulty breathing. You have chest pain. You have blood in your urine or stool, or you vomit blood. Summary After the procedure, it is common to have a sore throat or nausea. It is also common to feel tired. Have a responsible adult stay with you for the time you are told. It is important to have someone help care for you until you are awake and alert. When you feel hungry, start by eating small amounts of foods that are soft and easy to digest (bland), such as toast. Gradually return to your regular diet. Drink enough fluid to keep your urine pale yellow. Return to your normal activities as told by your health care provider. Ask your health care provider what activities are safe for you. This information is not intended to replace advice given to you by your health care provider. Make sure you discuss any questions you have with your health care provider. Document Revised: 04/10/2020 Document Reviewed: 11/08/2019 Elsevier Patient Education  2023 [employer]

## 2022-01-26 ENCOUNTER — Encounter (HOSPITAL_COMMUNITY): Payer: Self-pay

## 2022-01-26 ENCOUNTER — Other Ambulatory Visit: Payer: Self-pay

## 2022-01-26 ENCOUNTER — Encounter (HOSPITAL_COMMUNITY)
Admission: RE | Admit: 2022-01-26 | Discharge: 2022-01-26 | Disposition: A | Discharge status: Home / Self Care | Payer: 59 | Source: Ambulatory Visit | Attending: General Surgery | Admitting: General Surgery

## 2022-01-26 VITALS — BP 144/90 | HR 82 | Temp 97.8°F | Resp 18 | Ht 69.0 in | Wt 238.1 lb

## 2022-01-26 DIAGNOSIS — I10 Essential (primary) hypertension: Secondary | ICD-10-CM | POA: Insufficient documentation

## 2022-01-26 DIAGNOSIS — Z01810 Encounter for preprocedural cardiovascular examination: Secondary | ICD-10-CM | POA: Insufficient documentation

## 2022-01-27 ENCOUNTER — Telehealth: Payer: Self-pay | Admitting: *Deleted

## 2022-01-27 NOTE — Telephone Encounter (Signed)
Received forms for FMLA from [facility] [employer] for [place]~ [phone]~ fax and  Hospital Benefits from [facility] [phone]~ fax  Received call from patient   Surgical Date: 01/29/2022 Procedure: Umbilical Hernia Repair  Return to Work Date:  6- 8 weeks per Dr. Henreitta Leber. Patient is requesting to have full 8 weeks due to physically demanding job.

## 2022-01-29 ENCOUNTER — Ambulatory Visit (HOSPITAL_COMMUNITY): Payer: 59 | Admitting: Anesthesiology

## 2022-01-29 ENCOUNTER — Encounter (HOSPITAL_COMMUNITY)
Admission: RE | Disposition: A | Discharge status: Home / Self Care | Payer: Self-pay | Source: Home / Self Care | Attending: General Surgery

## 2022-01-29 ENCOUNTER — Ambulatory Visit (HOSPITAL_BASED_OUTPATIENT_CLINIC_OR_DEPARTMENT_OTHER): Payer: 59 | Admitting: Anesthesiology

## 2022-01-29 ENCOUNTER — Ambulatory Visit (HOSPITAL_COMMUNITY)
Admission: RE | Admit: 2022-01-29 | Discharge: 2022-01-29 | Disposition: A | Discharge status: Home / Self Care | Payer: 59 | Attending: General Surgery | Admitting: General Surgery

## 2022-01-29 ENCOUNTER — Other Ambulatory Visit: Payer: Self-pay

## 2022-01-29 ENCOUNTER — Encounter (HOSPITAL_COMMUNITY): Payer: Self-pay | Admitting: General Surgery

## 2022-01-29 DIAGNOSIS — K760 Fatty (change of) liver, not elsewhere classified: Secondary | ICD-10-CM

## 2022-01-29 DIAGNOSIS — I10 Essential (primary) hypertension: Secondary | ICD-10-CM | POA: Diagnosis not present

## 2022-01-29 DIAGNOSIS — K420 Umbilical hernia with obstruction, without gangrene: Secondary | ICD-10-CM | POA: Diagnosis present

## 2022-01-29 DIAGNOSIS — R0683 Snoring: Secondary | ICD-10-CM | POA: Insufficient documentation

## 2022-01-29 DIAGNOSIS — D6851 Activated protein C resistance: Secondary | ICD-10-CM | POA: Insufficient documentation

## 2022-01-29 DIAGNOSIS — Z86010 Personal history of colonic polyps: Secondary | ICD-10-CM

## 2022-01-29 DIAGNOSIS — M1990 Unspecified osteoarthritis, unspecified site: Secondary | ICD-10-CM | POA: Insufficient documentation

## 2022-01-29 DIAGNOSIS — S46219A Strain of muscle, fascia and tendon of other parts of biceps, unspecified arm, initial encounter: Secondary | ICD-10-CM

## 2022-01-29 DIAGNOSIS — K429 Umbilical hernia without obstruction or gangrene: Secondary | ICD-10-CM

## 2022-01-29 DIAGNOSIS — M25529 Pain in unspecified elbow: Secondary | ICD-10-CM

## 2022-01-29 HISTORY — PX: UMBILICAL HERNIA REPAIR: SHX196

## 2022-01-29 SURGERY — REPAIR, HERNIA, UMBILICAL, ADULT
Anesthesia: General | Site: Abdomen

## 2022-01-29 MED ORDER — FENTANYL CITRATE (PF) 100 MCG/2ML IJ SOLN
INTRAMUSCULAR | Status: DC | PRN
Start: 2022-01-29 — End: 2022-01-29
  Administered 2022-01-29 (×2): 50 ug via INTRAVENOUS
  Administered 2022-01-29 (×2): 25 ug via INTRAVENOUS

## 2022-01-29 MED ORDER — HYDROMORPHONE HCL 1 MG/ML IJ SOLN
0.2500 mg | INTRAMUSCULAR | Status: DC | PRN
  Administered 2022-01-29: 0.5 mg via INTRAVENOUS
  Filled 2022-01-29: qty 0.5

## 2022-01-29 MED ORDER — PROPOFOL 10 MG/ML IV BOLUS
INTRAVENOUS | Status: DC | PRN
  Administered 2022-01-29: 200 mg via INTRAVENOUS

## 2022-01-29 MED ORDER — GLYCOPYRROLATE 0.2 MG/ML IJ SOLN
INTRAMUSCULAR | Status: DC | PRN
  Administered 2022-01-29: .2 mg via INTRAVENOUS

## 2022-01-29 MED ORDER — CEFAZOLIN SODIUM-DEXTROSE 2-4 GM/100ML-% IV SOLN
2.0000 g | INTRAVENOUS | Status: AC
  Administered 2022-01-29: 2 g via INTRAVENOUS
  Filled 2022-01-29: qty 100

## 2022-01-29 MED ORDER — LACTATED RINGERS IV SOLN
INTRAVENOUS | Status: DC
  Administered 2022-01-29: 1000 mL via INTRAVENOUS

## 2022-01-29 MED ORDER — ROCURONIUM BROMIDE 10 MG/ML (PF) SYRINGE
PREFILLED_SYRINGE | INTRAVENOUS | Status: AC
  Filled 2022-01-29: qty 10

## 2022-01-29 MED ORDER — GLYCOPYRROLATE PF 0.2 MG/ML IJ SOSY
PREFILLED_SYRINGE | INTRAMUSCULAR | Status: AC
  Filled 2022-01-29: qty 1

## 2022-01-29 MED ORDER — MEPERIDINE HCL 50 MG/ML IJ SOLN: 6.2500 mg | INTRAMUSCULAR | Status: DC | PRN

## 2022-01-29 MED ORDER — OXYCODONE HCL 5 MG PO TABS: 5.0000 mg | ORAL_TABLET | ORAL | 0 refills | Status: DC | PRN

## 2022-01-29 MED ORDER — PROPOFOL 10 MG/ML IV BOLUS
INTRAVENOUS | Status: AC
  Filled 2022-01-29: qty 20

## 2022-01-29 MED ORDER — ONDANSETRON HCL 4 MG PO TABS: 4.0000 mg | ORAL_TABLET | Freq: Three times a day (TID) | ORAL | 1 refills | Status: DC | PRN

## 2022-01-29 MED ORDER — LIDOCAINE HCL (PF) 2 % IJ SOLN
INTRAMUSCULAR | Status: AC
  Filled 2022-01-29: qty 5

## 2022-01-29 MED ORDER — ONDANSETRON HCL 4 MG/2ML IJ SOLN
INTRAMUSCULAR | Status: DC | PRN
  Administered 2022-01-29: 4 mg via INTRAVENOUS

## 2022-01-29 MED ORDER — BUPIVACAINE LIPOSOME 1.3 % IJ SUSP
INTRAMUSCULAR | Status: DC | PRN
  Administered 2022-01-29: 20 mL

## 2022-01-29 MED ORDER — ROCURONIUM BROMIDE 100 MG/10ML IV SOLN
INTRAVENOUS | Status: DC | PRN
  Administered 2022-01-29: 60 mg via INTRAVENOUS

## 2022-01-29 MED ORDER — FENTANYL CITRATE (PF) 250 MCG/5ML IJ SOLN
INTRAMUSCULAR | Status: AC
  Filled 2022-01-29: qty 5

## 2022-01-29 MED ORDER — CHLORHEXIDINE GLUCONATE CLOTH 2 % EX PADS: 6.0000 | MEDICATED_PAD | Freq: Once | CUTANEOUS | Status: DC

## 2022-01-29 MED ORDER — SODIUM CHLORIDE 0.9 % IR SOLN
Status: DC | PRN
  Administered 2022-01-29: 1000 mL

## 2022-01-29 MED ORDER — SUGAMMADEX SODIUM 500 MG/5ML IV SOLN
INTRAVENOUS | Status: DC | PRN
  Administered 2022-01-29: 200 mg via INTRAVENOUS

## 2022-01-29 MED ORDER — ONDANSETRON HCL 4 MG/2ML IJ SOLN
INTRAMUSCULAR | Status: AC
  Filled 2022-01-29: qty 2

## 2022-01-29 MED ORDER — ONDANSETRON HCL 4 MG/2ML IJ SOLN: 4.0000 mg | Freq: Once | INTRAMUSCULAR | Status: DC | PRN

## 2022-01-29 MED ORDER — ORAL CARE MOUTH RINSE: 15.0000 mL | Freq: Once | OROMUCOSAL | Status: AC

## 2022-01-29 MED ORDER — CHLORHEXIDINE GLUCONATE 0.12 % MT SOLN
15.0000 mL | Freq: Once | OROMUCOSAL | Status: AC
  Administered 2022-01-29: 15 mL via OROMUCOSAL
  Filled 2022-01-29: qty 15

## 2022-01-29 SURGICAL SUPPLY — 37 items
ADH SKN CLS APL DERMABOND .7 (GAUZE/BANDAGES/DRESSINGS) ×1
APL PRP STRL LF DISP 70% ISPRP (MISCELLANEOUS) ×1
BLADE SURG 15 STRL LF DISP TIS (BLADE) ×1 IMPLANT
BLADE SURG 15 STRL SS (BLADE) ×2
CHLORAPREP W/TINT 26 (MISCELLANEOUS) ×2 IMPLANT
CLOTH BEACON ORANGE TIMEOUT ST (SAFETY) ×2 IMPLANT
COVER LIGHT HANDLE STERIS (MISCELLANEOUS) ×4 IMPLANT
DERMABOND ADVANCED (GAUZE/BANDAGES/DRESSINGS) ×1
DERMABOND ADVANCED .7 DNX12 (GAUZE/BANDAGES/DRESSINGS) ×1 IMPLANT
DRSG TEGADERM 2-3/8X2-3/4 SM (GAUZE/BANDAGES/DRESSINGS) ×1 IMPLANT
ELECT REM PT RETURN 9FT ADLT (ELECTROSURGICAL) ×2
ELECTRODE REM PT RTRN 9FT ADLT (ELECTROSURGICAL) ×1 IMPLANT
GLOVE BIO SURGEON STRL SZ 6.5 (GLOVE) ×2 IMPLANT
GLOVE BIOGEL PI IND STRL 6.5 (GLOVE) ×1 IMPLANT
GLOVE BIOGEL PI IND STRL 7.0 (GLOVE) ×2 IMPLANT
GLOVE BIOGEL PI INDICATOR 6.5 (GLOVE) ×1
GLOVE BIOGEL PI INDICATOR 7.0 (GLOVE) ×2
GOWN STRL REUS W/TWL LRG LVL3 (GOWN DISPOSABLE) ×4 IMPLANT
INST SET MINOR GENERAL (KITS) ×2 IMPLANT
KIT TURNOVER KIT A (KITS) ×2 IMPLANT
MANIFOLD NEPTUNE II (INSTRUMENTS) ×2 IMPLANT
MESH VENTRALEX ST 1-7/10 CRC S (Mesh General) ×1 IMPLANT
NDL HYPO 18GX1.5 BLUNT FILL (NEEDLE) ×1 IMPLANT
NDL HYPO 21X1.5 SAFETY (NEEDLE) ×1 IMPLANT
NEEDLE HYPO 18GX1.5 BLUNT FILL (NEEDLE) ×2 IMPLANT
NEEDLE HYPO 21X1.5 SAFETY (NEEDLE) ×2 IMPLANT
NS IRRIG 1000ML POUR BTL (IV SOLUTION) ×2 IMPLANT
PACK MINOR (CUSTOM PROCEDURE TRAY) ×2 IMPLANT
PAD ARMBOARD 7.5X6 YLW CONV (MISCELLANEOUS) ×2 IMPLANT
PENCIL SMOKE EVACUATOR (MISCELLANEOUS) ×2 IMPLANT
SET BASIN LINEN APH (SET/KITS/TRAYS/PACK) ×2 IMPLANT
SPONGE GAUZE 2X2 8PLY STRL LF (GAUZE/BANDAGES/DRESSINGS) ×1 IMPLANT
SUT ETHIBOND NAB MO 7 #0 18IN (SUTURE) ×2 IMPLANT
SUT MNCRL AB 4-0 PS2 18 (SUTURE) ×2 IMPLANT
SUT VIC AB 3-0 SH 27 (SUTURE) ×4
SUT VIC AB 3-0 SH 27X BRD (SUTURE) ×1 IMPLANT
SYR 20ML LL LF (SYRINGE) ×4 IMPLANT

## 2022-01-29 NOTE — Anesthesia Procedure Notes (Signed)
Procedure Name: Intubation Date/Time: 01/29/2022 7:41 AM  Performed by: Franco Nones, CRNAPre-anesthesia Checklist: Patient identified, Patient being monitored, Timeout performed, Emergency Drugs available and Suction available Patient Re-evaluated:Patient Re-evaluated prior to induction Oxygen Delivery Method: Circle system utilized Preoxygenation: Pre-oxygenation with 100% oxygen Induction Type: IV induction Ventilation: Mask ventilation without difficulty Laryngoscope Size: Mac and 3 Grade View: Grade I Tube type: Oral Tube size: 7.5 mm Number of attempts: 1 Airway Equipment and Method: Stylet Placement Confirmation: ETT inserted through vocal cords under direct vision, positive ETCO2 and breath sounds checked- equal and bilateral Secured at: 23 cm Tube secured with: Tape Dental Injury: Teeth and Oropharynx as per pre-operative assessment

## 2022-02-02 ENCOUNTER — Encounter (HOSPITAL_COMMUNITY): Payer: Self-pay | Admitting: General Surgery

## 2022-02-11 ENCOUNTER — Telehealth: Payer: Self-pay | Admitting: *Deleted

## 2022-02-11 NOTE — Telephone Encounter (Signed)
Received call from patient [phone]~ telephone.   Surgical Date: 01/29/2022 Procedure: Umbilical Hernia Repair W/ Mesh  Patient inquired as to when he can safely ride motorcycle.   Please advise.

## 2022-02-12 NOTE — Telephone Encounter (Signed)
Discussed with Dr. Henreitta Leber.   Recommendations are as follows: Patient is able to drive/ ride motorcycle now if patient is comfortable with movement required (twisting, turning, etc) Refrain from using narcotic pain medication prior to ride.   Call placed to patient and patient made aware.

## 2022-02-12 NOTE — Telephone Encounter (Signed)
Received fax from [facility] for STD forms. [phone]- [phone]~ telephone/ [phone]~ fax  Representative: Carolee Rota: [phone]  Surgical Date: 01/29/2022 Procedure: Umbilical Hernia Repair  Requested Beginning Date: 01/29/2022 Return to Work Date: 03/29/2022  Faxed to [facility] and received confirmation.

## 2022-03-09 ENCOUNTER — Encounter: Payer: Self-pay | Admitting: General Surgery

## 2022-03-09 ENCOUNTER — Ambulatory Visit (INDEPENDENT_AMBULATORY_CARE_PROVIDER_SITE_OTHER): Payer: 59 | Admitting: General Surgery

## 2022-03-09 VITALS — BP 136/88 | HR 74 | Temp 98.1°F | Resp 14 | Ht 69.0 in | Wt 234.0 lb

## 2022-03-09 DIAGNOSIS — K429 Umbilical hernia without obstruction or gangrene: Secondary | ICD-10-CM

## 2022-03-09 NOTE — Progress Notes (Signed)
[facility]  Doing great. No pain.   BP 136/88   Pulse 74   Temp 98.1 F (36.7 C) (Oral)   Resp 14   Ht 5\' 9"  (1.753 m)   Wt 234 lb (106.1 kg)   SpO2 98%   BMI 34.56 kg/m  Healed umbilical hernia site  Patient s/p umbilical hernia repair with mesh. Doing well.  Ok to slowly start lifting. Return to work on August 21.   August 23, MD [facility] [address] [place], [place] [phone] (office)

## 2022-03-09 NOTE — Patient Instructions (Signed)
Ok to slowly start lifting. Return to work on August 21.

## 2022-03-22 ENCOUNTER — Other Ambulatory Visit: Payer: Self-pay | Admitting: Family Medicine

## 2022-04-15 ENCOUNTER — Encounter: Payer: Self-pay | Admitting: Family Medicine

## 2022-04-19 ENCOUNTER — Emergency Department (HOSPITAL_COMMUNITY)
Admission: EM | Admit: 2022-04-19 | Discharge: 2022-04-19 | Disposition: A | Discharge status: Home / Self Care | Payer: 59 | Attending: Emergency Medicine | Admitting: Emergency Medicine

## 2022-04-19 ENCOUNTER — Emergency Department (HOSPITAL_COMMUNITY): Payer: 59

## 2022-04-19 ENCOUNTER — Encounter (HOSPITAL_COMMUNITY): Payer: Self-pay

## 2022-04-19 ENCOUNTER — Other Ambulatory Visit: Payer: Self-pay

## 2022-04-19 DIAGNOSIS — N201 Calculus of ureter: Secondary | ICD-10-CM

## 2022-04-19 DIAGNOSIS — N132 Hydronephrosis with renal and ureteral calculous obstruction: Secondary | ICD-10-CM | POA: Diagnosis not present

## 2022-04-19 DIAGNOSIS — I10 Essential (primary) hypertension: Secondary | ICD-10-CM | POA: Insufficient documentation

## 2022-04-19 DIAGNOSIS — R109 Unspecified abdominal pain: Secondary | ICD-10-CM | POA: Diagnosis present

## 2022-04-19 LAB — CBC
HCT: 44.8 % (ref 39.0–52.0)
Hemoglobin: 15.2 g/dL (ref 13.0–17.0)
MCH: 31.5 pg (ref 26.0–34.0)
MCHC: 33.9 g/dL (ref 30.0–36.0)
MCV: 92.9 fL (ref 80.0–100.0)
Platelets: 187 10*3/uL (ref 150–400)
RBC: 4.82 MIL/uL (ref 4.22–5.81)
RDW: 12.7 % (ref 11.5–15.5)
WBC: 11.4 10*3/uL — ABNORMAL HIGH (ref 4.0–10.5)
nRBC: 0 % (ref 0.0–0.2)

## 2022-04-19 LAB — URINALYSIS, ROUTINE W REFLEX MICROSCOPIC
Bacteria, UA: NONE SEEN
Bilirubin Urine: NEGATIVE
Glucose, UA: NEGATIVE mg/dL
Ketones, ur: NEGATIVE mg/dL
Leukocytes,Ua: NEGATIVE
Nitrite: NEGATIVE
Protein, ur: NEGATIVE mg/dL
Specific Gravity, Urine: 1.004 — ABNORMAL LOW (ref 1.005–1.030)
pH: 6 (ref 5.0–8.0)

## 2022-04-19 LAB — BASIC METABOLIC PANEL
Anion gap: 7 (ref 5–15)
BUN: 20 mg/dL (ref 6–20)
CO2: 24 mmol/L (ref 22–32)
Calcium: 8.9 mg/dL (ref 8.9–10.3)
Chloride: 108 mmol/L (ref 98–111)
Creatinine, Ser: 1.22 mg/dL (ref 0.61–1.24)
GFR, Estimated: >60 mL/min (ref >60)
Glucose, Bld: 134 mg/dL — ABNORMAL HIGH (ref 70–99)
Potassium: 4.2 mmol/L (ref 3.5–5.1)
Sodium: 139 mmol/L (ref 135–145)

## 2022-04-19 MED ORDER — OXYCODONE HCL 5 MG PO TABS: 5.0000 mg | ORAL_TABLET | ORAL | 0 refills | Status: DC | PRN

## 2022-04-19 MED ORDER — TAMSULOSIN HCL 0.4 MG PO CAPS: 0.4000 mg | ORAL_CAPSULE | Freq: Every day | ORAL | 0 refills | Status: AC

## 2022-04-19 MED ORDER — ACETAMINOPHEN 500 MG PO TABS
1000.0000 mg | ORAL_TABLET | Freq: Once | ORAL | Status: AC
  Administered 2022-04-19: 1000 mg via ORAL
  Filled 2022-04-19: qty 2

## 2022-04-19 MED ORDER — ONDANSETRON HCL 4 MG/2ML IJ SOLN
4.0000 mg | Freq: Once | INTRAMUSCULAR | Status: AC
  Administered 2022-04-19: 4 mg via INTRAVENOUS
  Filled 2022-04-19: qty 2

## 2022-04-19 MED ORDER — LACTATED RINGERS IV BOLUS
1000.0000 mL | Freq: Once | INTRAVENOUS | Status: AC
  Administered 2022-04-19: 1000 mL via INTRAVENOUS

## 2022-04-19 NOTE — ED Triage Notes (Signed)
Patient states "I think I got a kidney stone." Hx of kidney stone. Left flank pain without dysuria for 5 days.

## 2022-04-19 NOTE — ED Provider Notes (Signed)
[facility] EMERGENCY DEPARTMENT Provider Note   CSN: [id number] Arrival date & time: 04/19/22  2952     History Chief Complaint  Patient presents with   Flank Pain    HPI Christopher Lawson is a 51 y.o. male presenting for left-sided flank pain.  Patient's history nephrolithiasis and states that last night he woke up at 1 AM with a sharp stabbing pain in his left flank.  He states it is improved over the last 2 to 3 hours.  He denies fevers or chills nausea vomiting syncope shortness of breath.  Than his left upper flank.  He denies lower back pain.  He states that when he got up to come here he feels that his pain has been gradually improving.  History of similar.  No medications prior to arrival.  Patient's recorded medical, surgical, social, medication list and allergies were reviewed in the Snapshot window as part of the initial history.   Review of Systems   Review of Systems  Constitutional:  Negative for chills and fever.  HENT:  Negative for ear pain and sore throat.   Eyes:  Negative for pain and visual disturbance.  Respiratory:  Negative for cough and shortness of breath.   Cardiovascular:  Negative for chest pain and palpitations.  Gastrointestinal:  Negative for abdominal pain and vomiting.  Genitourinary:  Positive for flank pain. Negative for dysuria and hematuria.  Musculoskeletal:  Negative for arthralgias and back pain.  Skin:  Negative for color change and rash.  Neurological:  Negative for seizures and syncope.  All other systems reviewed and are negative.   Physical Exam Updated Vital Signs BP (!) 138/94   Pulse 68   Resp 17   Ht 5\' 9"  (1.753 m)   Wt 105 kg   SpO2 98%   BMI 34.18 kg/m  Physical Exam Vitals and nursing note reviewed.  Constitutional:      General: He is not in acute distress.    Appearance: He is well-developed.  HENT:     Head: Normocephalic and atraumatic.  Eyes:     Conjunctiva/sclera: Conjunctivae normal.  Cardiovascular:      Rate and Rhythm: Normal rate and regular rhythm.     Heart sounds: No murmur heard. Pulmonary:     Effort: Pulmonary effort is normal. No respiratory distress.     Breath sounds: Normal breath sounds.  Abdominal:     Palpations: Abdomen is soft.     Tenderness: There is no abdominal tenderness.  Musculoskeletal:        General: No swelling.     Cervical back: Neck supple.  Skin:    General: Skin is warm and dry.     Capillary Refill: Capillary refill takes less than 2 seconds.  Neurological:     Mental Status: He is alert.  Psychiatric:        Mood and Affect: Mood normal.      ED Course/ Medical Decision Making/ A&P    Procedures Procedures   Medications Ordered in ED Medications  lactated ringers bolus 1,000 mL (1,000 mLs Intravenous New Bag/Given 04/19/22 0755)  ondansetron (ZOFRAN) injection 4 mg (4 mg Intravenous Given 04/19/22 0754)  acetaminophen (TYLENOL) tablet 1,000 mg (1,000 mg Oral Given 04/19/22 0755)   Medical Decision Making:   Christopher Lawson is a 51 y.o. male who presented to the ED today with flank pain, detailed above.    Patient's presentation is complicated by their history of Recent hernia surgery, hypertension on outpatient  medication regimen.  Patient placed on continuous vitals and telemetry monitoring while in ED which was reviewed periodically.  Complete initial physical exam performed, notably the patient  was hemodynamically stable in no acute distress.  He has no CVA tenderness at this time.  He states that his symptoms are overall improving.     Reviewed and confirmed nursing documentation for past medical history, family history, social history.    Initial Assessment:   With the patient's presentation of flank pain, most likely diagnosis is nonspecific etiology. Other diagnoses were considered including (but not limited to) gastroenteritis, colitis, small bowel obstruction, appendicitis, cholecystitis, pancreatitis, nephrolithiasis, UTI,  pyleonephritis, testicular torsion. These are considered less likely due to history of present illness and physical exam findings.   This is most consistent with an acute life/limb threatening illness complicated by underlying chronic conditions.   Initial Plan:  CBC/CMP to evaluate for underlying infectious/metabolic etiology for patient's abdominal pain  Lipase to evaluate for pancreatitis  CT Ab/pelvis without contrast due to favored nephrolithiasis over GI etiology for patient's abdominal pain  Urinalysis and repeat physical assessment to evaluate for UTI/Pyelonpehritis  Empiric management of symptoms with escalating pain control and antiemetics as needed.   Initial Study Results:   Laboratory  All laboratory results reviewed without evidence of clinically relevant pathology.   Exceptions include: Slight bump in creatinine from 0.98 -1.2   Radiology All images reviewed independently. Agree with radiology report at this time.   CT Renal Stone Study  Result Date: 04/19/2022 CLINICAL DATA:  Nephrolithiasis, left flank pain EXAM: CT ABDOMEN AND PELVIS WITHOUT CONTRAST TECHNIQUE: Multidetector CT imaging of the abdomen and pelvis was performed following the standard protocol without IV contrast. RADIATION DOSE REDUCTION: This exam was performed according to the departmental dose-optimization program which includes automated exposure control, adjustment of the mA and/or kV according to patient size and/or use of iterative reconstruction technique. COMPARISON:  None Available. FINDINGS: Lower chest: No acute abnormality. Hepatobiliary: Hepatic steatosis. Gallbladder is unremarkable. No biliary dilatation. Pancreas: Unremarkable. No pancreatic ductal dilatation or surrounding inflammatory changes. Spleen: Unremarkable. Adrenals/Urinary Tract: Adrenals are unremarkable. No renal calculi. There is mild left hydronephrosis. An obstructing 4 mm stone is present within the proximal left ureter. The bladder  is unremarkable. Stomach/Bowel: Stomach is within normal limits. Bowel is normal in caliber. Normal appendix. Vascular/Lymphatic: No significant vascular abnormality. No enlarged nodes. Reproductive: Unremarkable. Other: No acute abnormality of the abdominal wall. Musculoskeletal: Lower lumbar degenerative changes. No acute osseous abnormality. IMPRESSION: Obstructing 4 mm stone of the proximal left ureter with mild hydronephrosis. Electronically Signed   By: Guadlupe Spanish M.D.   On: 04/19/2022 08:01     Final Reassessment and Plan:   Patient with small renal stone without evidence of renal insufficiency at this time.  No evidence of AKI though he does have slightly rising creatinine.  Treated with IV fluids and will be started on Flomax.  Recommended close follow-up with urology and referral was placed.  Patient stable for outpatient care management this time with well-controlled pain ambulatory tolerating p.o. intake.  Patient discharged with no further acute events.     Clinical Impression:  1. Calculus of ureter      Discharge   Final Clinical Impression(s) / ED Diagnoses Final diagnoses:  Calculus of ureter    Rx / DC Orders ED Discharge Orders          Ordered    Ambulatory referral to Urology        04/19/22  [id number]    tamsulosin (FLOMAX) 0.4 MG CAPS capsule  Daily        04/19/22 0852    oxyCODONE (ROXICODONE) 5 MG immediate release tablet  Every 4 hours PRN        04/19/22 [id number]              Tretha Sciara, MD 04/19/22 [phone]

## 2022-04-20 ENCOUNTER — Ambulatory Visit: Payer: 59 | Admitting: Urology

## 2022-04-20 ENCOUNTER — Encounter: Payer: Self-pay | Admitting: Urology

## 2022-04-20 ENCOUNTER — Telehealth: Payer: Self-pay

## 2022-04-20 VITALS — BP 144/90 | HR 88 | Ht 70.0 in | Wt 233.0 lb

## 2022-04-20 DIAGNOSIS — N201 Calculus of ureter: Secondary | ICD-10-CM

## 2022-04-20 LAB — URINALYSIS, ROUTINE W REFLEX MICROSCOPIC
Bilirubin, UA: NEGATIVE
Glucose, UA: NEGATIVE
Ketones, UA: NEGATIVE
Leukocytes,UA: NEGATIVE
Nitrite, UA: NEGATIVE
Protein,UA: NEGATIVE
Specific Gravity, UA: <1.005 — ABNORMAL LOW (ref 1.005–1.030)
Urobilinogen, Ur: 0.2 mg/dL (ref 0.2–1.0)
pH, UA: 6 (ref 5.0–7.5)

## 2022-04-20 LAB — MICROSCOPIC EXAMINATION
Bacteria, UA: NONE SEEN
Epithelial Cells (non renal): NONE SEEN /hpf (ref 0–10)
RBC, Urine: NONE SEEN /hpf (ref 0–2)
Renal Epithel, UA: NONE SEEN /hpf
WBC, UA: NONE SEEN /hpf (ref 0–5)

## 2022-04-20 NOTE — Progress Notes (Signed)
Assessment: 1. Ureteral calculus, left, proximal, 4 mm     Plan: I reviewed the records from the patient's ER visit on 04/19/2022 including provider notes and lab results. I personally reviewed the CT study from 04/19/2022 showing a 4 mm calculus in the proximal left ureter with mild obstructive changes. Diagnosis and treatment options for a ureteral calculus including spontaneous stone passage and ureteroscopic stone manipulation were discussed with Christopher Lawson in detail. He has elected to attempt to pass the ureteral calculus. Strain urine, Pain medication as needed.  Rx provided., and Alpha blocker for medical expulsive therapy.  Off label use discussed.  Use and side effects reviewed.  Rx provided. Follow-up in 1 weeks with [facility]. Return to office or ER for uncontrolled pain, vomiting, fever, or chills.   Chief Complaint:  Chief Complaint  Patient presents with   Ureteral calculus    History of Present Illness:  Christopher Lawson is a 51 y.o. male who is seen in consultation from Christopher Ade, MD for evaluation of a left ureteral calculus.  He presented to the emergency room on 04/19/2022 with acute onset of left-sided flank pain.  His symptoms were present for approximately 4 days prior to presentation.  He did have associated nausea without vomiting.  No fevers or chills.  No dysuria or gross hematuria.  He has a prior history of nephrolithiasis, passing a stone in April 2023.  No imaging was performed at that time.  Creatinine normal at 1.22.  White blood cell count slightly elevated 11.4.  Urinalysis unremarkable.  CT urogram showed a 4 mm calculus in the proximal left ureter with mild left hydronephrosis, no other renal calculi. His symptoms have been fairly well controlled.  He took pain medication last night with improvement in his flank pain.  He is tolerating liquids.  He has been straining his urine.  He is currently on tamsulosin.   Past Medical History:  Past Medical  History:  Diagnosis Date   Abrasion of right index finger 11/20/2012   Arthritis    left shoulder - to have surgery 12/12/2012   Cellulitis of left lower limb    Dental crown present    Deviated nasal septum 11/2012   Essential (primary) hypertension    Factor V deficiency (HCC)    states never had a problem   Fatty (change of) liver, not elsewhere classified    Fatty liver    Hypertension    under control with meds., has been on med. x 5 yr.   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   Obesity, unspecified    Pain in left knee    Pain in right foot    Pain in right shoulder    Sebaceous cyst    Spontaneous rupture of other tendons, right upper arm    Tinnitus, right ear    Unilateral primary osteoarthritis, left knee     Past Surgical History:  Past Surgical History:  Procedure Laterality Date   bicep tendon surgery Right    CYST EXCISION     wrist and back (same surgery)   INGUINAL HERNIA REPAIR Right    left shoulder surgery     NASAL SEPTOPLASTY W/ TURBINOPLASTY Bilateral 11/27/2012   Procedure: NASAL SEPTOPLASTY WITH BILATERAL TURBINATE RESECTION;  Surgeon: Darletta Moll, MD;  Location: [facility];  Service: ENT;  Laterality: Bilateral;   UMBILICAL HERNIA REPAIR N/A 01/29/2022   Procedure: HERNIA REPAIR UMBILICAL ADULT WITH MESH;  Surgeon: Algis Greenhouse  C, MD;  Location: [facility];  Service: General;  Laterality: N/A;   WISDOM TOOTH EXTRACTION      Allergies:  No Known Allergies  Family History:  No family history on file.  Social History:  Social History   Tobacco Use   Smoking status: Never   Smokeless tobacco: Never  Vaping Use   Vaping Use: Never used  Substance Use Topics   Alcohol use: Yes    Comment: occasionally   Drug use: No    Review of symptoms:  Constitutional:  Negative for unexplained weight loss, night sweats, fever, chills ENT:  Negative for nose bleeds, sinus pain, painful swallowing CV:  Negative for chest pain,  shortness of breath, exercise intolerance, palpitations, loss of consciousness Resp:  Negative for cough, wheezing, shortness of breath GI:  Negative for vomiting, diarrhea, bloody stools GU:  Positives noted in HPI; otherwise negative for gross hematuria, dysuria, urinary incontinence Neuro:  Negative for seizures, poor balance, limb weakness, slurred speech Psych:  Negative for lack of energy, depression, anxiety Endocrine:  Negative for polydipsia, polyuria, symptoms of hypoglycemia (dizziness, hunger, sweating) Hematologic:  Negative for anemia, purpura, petechia, prolonged or excessive bleeding, use of anticoagulants  Allergic:  Negative for difficulty breathing or choking as a result of exposure to anything; no shellfish allergy; no allergic response (rash/itch) to materials, foods  Physical exam: BP (!) 144/90   Pulse 88   Ht 5\' 10"  (1.778 m)   Wt 233 lb (105.7 kg)   BMI 33.43 kg/m  GENERAL APPEARANCE:  Well appearing, well developed, well nourished, NAD HEENT: Atraumatic, Normocephalic, oropharynx clear. NECK: Supple without lymphadenopathy or thyromegaly. LUNGS: Clear to auscultation bilaterally. HEART: Regular Rate and Rhythm without murmurs, gallops, or rubs. ABDOMEN: Soft, non-tender, No Masses. EXTREMITIES: Moves all extremities well.  Without clubbing, cyanosis, or edema. NEUROLOGIC:  Alert and oriented x 3, normal gait, CN II-XII grossly intact.  MENTAL STATUS:  Appropriate. BACK:  Non-tender to palpation.  No CVAT SKIN:  Warm, dry and intact.    Results: Results for orders placed or performed in visit on 04/20/22 (from the past 24 hour(s))  Urinalysis, Routine w reflex microscopic     Status: Abnormal   Collection Time: 04/20/22  3:10 PM  Result Value Ref Range   Specific Gravity, UA <1.005 (L) 1.005 - 1.030   pH, UA 6.0 5.0 - 7.5   Color, UA Yellow Yellow   Appearance Ur Clear Clear   Leukocytes,UA Negative Negative   Protein,UA Negative Negative/Trace    Glucose, UA Negative Negative   Ketones, UA Negative Negative   RBC, UA Trace (A) Negative   Bilirubin, UA Negative Negative   Urobilinogen, Ur 0.2 0.2 - 1.0 mg/dL   Nitrite, UA Negative Negative   Microscopic Examination See below:    Narrative   Performed at:  [address] - [facility] [address], [place], [place]  [place] Lab Director: [id number] [place], Phone:  [phone]  Microscopic Examination     Status: None   Collection Time: 04/20/22  3:10 PM   Urine  Result Value Ref Range   WBC, UA None seen 0 - 5 /hpf   RBC, Urine None seen 0 - 2 /hpf   Epithelial Cells (non renal) None seen 0 - 10 /hpf   Renal Epithel, UA None seen None seen /hpf   Bacteria, UA None seen None seen/Few   Narrative   Performed at:  [address] - [facility] [address], [place], [place]  [place]  Lab Director: Chinita Pester [place], Phone:  [phone]

## 2022-04-20 NOTE — Telephone Encounter (Signed)
Transition Care Management Follow-up Telephone Call Date of discharge and from where: [facility] Dx: Calculus of Ureter How have you been since you were released from the hospital? Feels ok now, in the morning pt had some pain Any questions or concerns? No  Items Reviewed: Did the pt receive and understand the discharge instructions provided? Yes  Medications obtained and verified? Yes  Other? No  Any new allergies since your discharge? No  Dietary orders reviewed? No Do you have support at home? Yes   Home Care and Equipment/Supplies: Were home health services ordered? not applicable If so, what is the name of the agency? N/a  Has the agency set up a time to come to the patient's home? not applicable Were any new equipment or medical supplies ordered?  No What is the name of the medical supply agency? N/a Were you able to get the supplies/equipment? not applicable Do you have any questions related to the use of the equipment or supplies? No  Functional Questionnaire: (I = Independent and D = Dependent) ADLs: I  Bathing/Dressing- I  Meal Prep- I  Eating- I  Maintaining continence- I  Transferring/Ambulation- I  Managing Meds- I  Follow up appointments reviewed:  PCP Hospital f/u appt confirmed? Yes  Scheduled to see DR. PICKARD on 05/18/22 @ 3PM. Specialist [facility] f/u appt confirmed? Yes  Scheduled to see  Urologist 04/20/22 @ 1PM. Are transportation arrangements needed? No  If their condition worsens, is the pt aware to call PCP or go to the Emergency Dept.? Yes Was the patient provided with contact information for the PCP's office or ED? Yes Was to pt encouraged to call back with questions or concerns? Yes

## 2022-04-26 ENCOUNTER — Telehealth: Payer: Self-pay

## 2022-04-26 NOTE — Telephone Encounter (Signed)
Patient left a voice message 04-26-22.  Wanting a new return to work note. He returned early to work on Sept. 15, 2023. His original note stated a later date.  He would like the updated note to be faxed to [phone].   Att:  Shon Millet.  Any questions, please call pt.  Thanks, Helene Kelp

## 2022-04-27 NOTE — Telephone Encounter (Signed)
Work note created, will fax once Dr. Felipa Eth authorizes patient to return to work.

## 2022-04-29 ENCOUNTER — Encounter: Payer: Self-pay | Admitting: Urology

## 2022-04-29 ENCOUNTER — Ambulatory Visit: Payer: 59 | Admitting: Urology

## 2022-04-29 ENCOUNTER — Ambulatory Visit (HOSPITAL_COMMUNITY)
Admission: RE | Admit: 2022-04-29 | Discharge: 2022-04-29 | Disposition: A | Discharge status: Home / Self Care | Payer: 59 | Source: Ambulatory Visit | Attending: Urology | Admitting: Urology

## 2022-04-29 VITALS — BP 147/86 | HR 83 | Ht 70.0 in | Wt 233.0 lb

## 2022-04-29 DIAGNOSIS — N201 Calculus of ureter: Secondary | ICD-10-CM | POA: Diagnosis present

## 2022-04-29 NOTE — Progress Notes (Signed)
Assessment: 1. Ureteral calculus, left, proximal, 4 mm     Plan: [facility] today Will call him with results  Chief Complaint:  Chief Complaint  Patient presents with   ureteral calculus    History of Present Illness:  Christopher Lawson is a 51 y.o. male who is seen for continued evaluation of a left ureteral calculus.  He presented to the emergency room on 04/19/2022 with acute onset of left-sided flank pain.  His symptoms were present for approximately 4 days prior to presentation.  He did have associated nausea without vomiting.  No fevers or chills.  No dysuria or gross hematuria.  He has a prior history of nephrolithiasis, passing a stone in April 2023.  No imaging was performed at that time.  Creatinine normal at 1.22.  White blood cell count slightly elevated 11.4.  Urinalysis unremarkable.  CT urogram showed a 4 mm calculus in the proximal left ureter with mild left hydronephrosis, no other renal calculi.  He returns today for follow-up.  He has not had any flank pain since his visit last week.  He is not aware of passing a stone but has not been straining his urine on a regular basis.  No dysuria or gross hematuria.  Portions of the above documentation were copied from a prior visit for review purposes only.   Past Medical History:  Past Medical History:  Diagnosis Date   Abrasion of right index finger 11/20/2012   Arthritis    left shoulder - to have surgery 12/12/2012   Cellulitis of left lower limb    Dental crown present    Deviated nasal septum 11/2012   Essential (primary) hypertension    Factor V deficiency (HCC)    states never had a problem   Fatty (change of) liver, not elsewhere classified    Fatty liver    Hypertension    under control with meds., has been on med. x 5 yr.   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   Obesity, unspecified    Pain in left knee    Pain in right foot    Pain in right shoulder    Sebaceous cyst    Spontaneous rupture of other  tendons, right upper arm    Tinnitus, right ear    Unilateral primary osteoarthritis, left knee     Past Surgical History:  Past Surgical History:  Procedure Laterality Date   bicep tendon surgery Right    CYST EXCISION     wrist and back (same surgery)   INGUINAL HERNIA REPAIR Right    left shoulder surgery     NASAL SEPTOPLASTY W/ TURBINOPLASTY Bilateral 11/27/2012   Procedure: NASAL SEPTOPLASTY WITH BILATERAL TURBINATE RESECTION;  Surgeon: Darletta Moll, MD;  Location: [facility];  Service: ENT;  Laterality: Bilateral;   UMBILICAL HERNIA REPAIR N/A 01/29/2022   Procedure: HERNIA REPAIR UMBILICAL ADULT WITH MESH;  Surgeon: Lucretia Roers, MD;  Location: [facility];  Service: General;  Laterality: N/A;   WISDOM TOOTH EXTRACTION      Allergies:  No Known Allergies  Family History:  No family history on file.  Social History:  Social History   Tobacco Use   Smoking status: Never   Smokeless tobacco: Never  Vaping Use   Vaping Use: Never used  Substance Use Topics   Alcohol use: Yes    Comment: occasionally   Drug use: No    ROS: Constitutional:  Negative for fever, chills, weight loss CV:  Negative for chest pain, previous MI, hypertension Respiratory:  Negative for shortness of breath, wheezing, sleep apnea, frequent cough GI:  Negative for nausea, vomiting, bloody stool, GERD  Physical exam: BP (!) 147/86   Pulse 83   Ht 5\' 10"  (1.778 m)   Wt 233 lb (105.7 kg)   BMI 33.43 kg/m  GENERAL APPEARANCE:  Well appearing, well developed, well nourished, NAD HEENT:  Atraumatic, normocephalic, oropharynx clear NECK:  Supple without lymphadenopathy or thyromegaly ABDOMEN:  Soft, non-tender, no masses EXTREMITIES:  Moves all extremities well, without clubbing, cyanosis, or edema NEUROLOGIC:  Alert and oriented x 3, normal gait, CN II-XII grossly intact MENTAL STATUS:  appropriate BACK:  Non-tender to palpation, No CVAT SKIN:  Warm, dry, and  intact  Results: U/A: Dipstick negative

## 2022-04-30 ENCOUNTER — Encounter: Payer: Self-pay | Admitting: Urology

## 2022-04-30 LAB — URINALYSIS, ROUTINE W REFLEX MICROSCOPIC
Bilirubin, UA: NEGATIVE
Glucose, UA: NEGATIVE
Ketones, UA: NEGATIVE
Leukocytes,UA: NEGATIVE
Nitrite, UA: NEGATIVE
Protein,UA: NEGATIVE
RBC, UA: NEGATIVE
Specific Gravity, UA: 1.015 (ref 1.005–1.030)
Urobilinogen, Ur: 1 mg/dL (ref 0.2–1.0)
pH, UA: 8.5 — ABNORMAL HIGH (ref 5.0–7.5)

## 2022-05-03 ENCOUNTER — Other Ambulatory Visit: Payer: Self-pay | Admitting: Urology

## 2022-05-03 DIAGNOSIS — N201 Calculus of ureter: Secondary | ICD-10-CM

## 2022-05-06 ENCOUNTER — Ambulatory Visit (HOSPITAL_COMMUNITY)
Admission: RE | Admit: 2022-05-06 | Discharge: 2022-05-06 | Disposition: A | Discharge status: Home / Self Care | Payer: 59 | Source: Ambulatory Visit | Attending: Urology | Admitting: Urology

## 2022-05-06 DIAGNOSIS — N201 Calculus of ureter: Secondary | ICD-10-CM | POA: Insufficient documentation

## 2022-05-07 ENCOUNTER — Encounter: Payer: Self-pay | Admitting: Urology

## 2022-05-07 ENCOUNTER — Other Ambulatory Visit: Payer: Self-pay

## 2022-05-07 ENCOUNTER — Ambulatory Visit: Payer: 59 | Admitting: Urology

## 2022-05-07 VITALS — BP 146/90 | HR 80

## 2022-05-07 DIAGNOSIS — N201 Calculus of ureter: Secondary | ICD-10-CM | POA: Diagnosis not present

## 2022-05-07 LAB — POCT URINALYSIS DIPSTICK
Bilirubin, UA: NEGATIVE
Blood, UA: NEGATIVE
Glucose, UA: NEGATIVE
Ketones, UA: NEGATIVE
Leukocytes, UA: NEGATIVE
Nitrite, UA: NEGATIVE
Protein, UA: NEGATIVE
Spec Grav, UA: 1.01 (ref 1.010–1.025)
Urobilinogen, UA: 0.2 E.U./dL
pH, UA: 6.5 (ref 5.0–8.0)

## 2022-05-07 NOTE — Progress Notes (Signed)
Assessment: 1. Ureteral calculus; left, 4 mm     Plan: I personally reviewed the KUB from 05/06/2022 showing a persistent calcification over the left L3 transverse process. I discussed the KUB results with the patient today.  Options for management of the left ureteral calculus discussed including continued attempts at spontaneous passage, shockwave lithotripsy, or ureteroscopic laser lithotripsy.  Risk and benefits of each procedure reviewed.  He would like to proceed with shockwave lithotripsy.  The patient will be scheduled for left ESL at [facility].  Surgical request is placed with the surgery schedulers and will be scheduled at the patient's/family request. Informed consent is given as documented below. Anesthesia:  local  The patient does not have sleep apnea, history of MRSA, history of VRE, history of cardiac device requiring special anesthetic needs. Patient is stable and considered clear for surgical in an outpatient ambulatory surgery setting as well as patient hospital setting.  Consent for Operation or Procedure: Provider Certification I hereby certify that the nature, purpose, benefits, usual and most frequent risks of, and alternatives to, the operation or procedure have been explained to the patient (or person authorized to sign for the patient) either by me as responsible physician or by the provider who is to perform the operation or procedure. Time spent such that the patient/family has had an opportunity to ask questions, and that those questions have been answered. The patient or the patient's representative has been advised that selected tasks may be performed by assistants to the primary health care provider(s). I believe that the patient (or person authorized to sign for the patient) understands what has been explained, and has consented to the operation or procedure. No guarantees were implied or made.  Chief Complaint:  Chief Complaint  Patient presents with    ureteral calculus    History of Present Illness:  Christopher Lawson is a 51 y.o. male who is seen for continued evaluation of a left ureteral calculus.  He presented to the emergency room on 04/19/2022 with acute onset of left-sided flank pain.  His symptoms were present for approximately 4 days prior to presentation.  He did have associated nausea without vomiting.  No fevers or chills.  No dysuria or gross hematuria.  He has a prior history of nephrolithiasis, passing a stone in April 2023.  No imaging was performed at that time.  Creatinine normal at 1.22.  White blood cell count slightly elevated 11.4.  Urinalysis unremarkable.  CT urogram showed a 4 mm calculus in the proximal left ureter with mild left hydronephrosis, no other renal calculi. KUB from 04/29/2022 showed a 4 mm calcification at the level of the left L3 transverse process. KUB from 05/06/2022 again showed a calcification over the left L3 transverse process similar position to the prior study.  He returns today for follow-up.  He has not had any flank pain since his visit on 04/29/2022.  He has not passed a stone and has been straining his urine.  He continues on tamsulosin.  No fevers, chills, nausea, or vomiting.  No dysuria or gross hematuria.  Portions of the above documentation were copied from a prior visit for review purposes only.   Past Medical History:  Past Medical History:  Diagnosis Date   Abrasion of right index finger 11/20/2012   Arthritis    left shoulder - to have surgery 12/12/2012   Cellulitis of left lower limb    Dental crown present    Deviated nasal septum 11/2012  Essential (primary) hypertension    Factor V deficiency (HCC)    states never had a problem   Fatty (change of) liver, not elsewhere classified    Fatty liver    Hypertension    under control with meds., has been on med. x 5 yr.   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   Obesity, unspecified    Pain in left knee    Pain in right  foot    Pain in right shoulder    Sebaceous cyst    Spontaneous rupture of other tendons, right upper arm    Tinnitus, right ear    Unilateral primary osteoarthritis, left knee     Past Surgical History:  Past Surgical History:  Procedure Laterality Date   bicep tendon surgery Right    CYST EXCISION     wrist and back (same surgery)   INGUINAL HERNIA REPAIR Right    left shoulder surgery     NASAL SEPTOPLASTY W/ TURBINOPLASTY Bilateral 11/27/2012   Procedure: NASAL SEPTOPLASTY WITH BILATERAL TURBINATE RESECTION;  Surgeon: Darletta Moll, MD;  Location: [facility];  Service: ENT;  Laterality: Bilateral;   UMBILICAL HERNIA REPAIR N/A 01/29/2022   Procedure: HERNIA REPAIR UMBILICAL ADULT WITH MESH;  Surgeon: Lucretia Roers, MD;  Location: [facility];  Service: General;  Laterality: N/A;   WISDOM TOOTH EXTRACTION      Allergies:  No Known Allergies  Family History:  No family history on file.  Social History:  Social History   Tobacco Use   Smoking status: Never   Smokeless tobacco: Never  Vaping Use   Vaping Use: Never used  Substance Use Topics   Alcohol use: Yes    Comment: occasionally   Drug use: No    ROS: Constitutional:  Negative for fever, chills, weight loss CV: Negative for chest pain, previous MI, hypertension Respiratory:  Negative for shortness of breath, wheezing, sleep apnea, frequent cough GI:  Negative for nausea, vomiting, bloody stool, GERD  Physical exam: BP (!) 146/90   Pulse 80  GENERAL APPEARANCE:  Well appearing, well developed, well nourished, NAD HEENT:  Atraumatic, normocephalic, oropharynx clear NECK:  Supple without lymphadenopathy or thyromegaly ABDOMEN:  Soft, non-tender, no masses EXTREMITIES:  Moves all extremities well, without clubbing, cyanosis, or edema NEUROLOGIC:  Alert and oriented x 3, normal gait, CN II-XII grossly intact MENTAL STATUS:  appropriate BACK:  Non-tender to palpation, No CVAT SKIN:  Warm, dry,  and intact  Results: U/A dipstick negative

## 2022-05-10 ENCOUNTER — Encounter (HOSPITAL_COMMUNITY)
Admission: RE | Admit: 2022-05-10 | Discharge: 2022-05-10 | Disposition: A | Discharge status: Home / Self Care | Payer: 59 | Source: Ambulatory Visit | Attending: Urology | Admitting: Urology

## 2022-05-10 ENCOUNTER — Encounter (HOSPITAL_COMMUNITY): Payer: Self-pay | Admitting: Urology

## 2022-05-11 ENCOUNTER — Encounter (HOSPITAL_COMMUNITY): Payer: Self-pay | Admitting: Urology

## 2022-05-11 ENCOUNTER — Encounter (HOSPITAL_COMMUNITY)
Admission: RE | Disposition: A | Discharge status: Home / Self Care | Payer: Self-pay | Source: Home / Self Care | Attending: Urology

## 2022-05-11 ENCOUNTER — Ambulatory Visit (HOSPITAL_COMMUNITY): Payer: 59

## 2022-05-11 ENCOUNTER — Ambulatory Visit (HOSPITAL_COMMUNITY)
Admission: RE | Admit: 2022-05-11 | Discharge: 2022-05-11 | Disposition: A | Discharge status: Home / Self Care | Payer: 59 | Attending: Urology | Admitting: Urology

## 2022-05-11 DIAGNOSIS — K760 Fatty (change of) liver, not elsewhere classified: Secondary | ICD-10-CM | POA: Diagnosis not present

## 2022-05-11 DIAGNOSIS — I10 Essential (primary) hypertension: Secondary | ICD-10-CM | POA: Diagnosis not present

## 2022-05-11 DIAGNOSIS — N132 Hydronephrosis with renal and ureteral calculous obstruction: Secondary | ICD-10-CM | POA: Insufficient documentation

## 2022-05-11 DIAGNOSIS — D6851 Activated protein C resistance: Secondary | ICD-10-CM | POA: Diagnosis not present

## 2022-05-11 DIAGNOSIS — N201 Calculus of ureter: Secondary | ICD-10-CM

## 2022-05-11 HISTORY — DX: Personal history of urinary calculi: Z87.442

## 2022-05-11 HISTORY — PX: EXTRACORPOREAL SHOCK WAVE LITHOTRIPSY: SHX1557

## 2022-05-11 SURGERY — LITHOTRIPSY, ESWL
Anesthesia: LOCAL | Laterality: Left

## 2022-05-11 MED ORDER — DIAZEPAM 5 MG PO TABS
10.0000 mg | ORAL_TABLET | Freq: Once | ORAL | Status: AC
Start: 2022-05-11 — End: 2022-05-11
  Administered 2022-05-11: 10 mg via ORAL
  Filled 2022-05-11: qty 2

## 2022-05-11 MED ORDER — TAMSULOSIN HCL 0.4 MG PO CAPS: 0.4000 mg | ORAL_CAPSULE | Freq: Every day | ORAL | 1 refills | Status: DC

## 2022-05-11 MED ORDER — ONDANSETRON HCL 4 MG PO TABS: 4.0000 mg | ORAL_TABLET | Freq: Every day | ORAL | 1 refills | Status: DC | PRN

## 2022-05-11 MED ORDER — SODIUM CHLORIDE 0.9 % IV SOLN: Freq: Once | INTRAVENOUS | Status: AC

## 2022-05-11 MED ORDER — OXYCODONE HCL 5 MG PO TABS: 5.0000 mg | ORAL_TABLET | ORAL | 0 refills | Status: DC | PRN

## 2022-05-11 MED ORDER — DIPHENHYDRAMINE HCL 25 MG PO CAPS
25.0000 mg | ORAL_CAPSULE | ORAL | Status: AC
  Administered 2022-05-11: 25 mg via ORAL
  Filled 2022-05-11: qty 1

## 2022-05-12 NOTE — OR Nursing (Signed)
Christopher Lawson was at [facility] on 05/11/22 and cannot return to work until 1200 on 05/12/22 unless extension is made by doctor.

## 2022-05-13 ENCOUNTER — Encounter (HOSPITAL_COMMUNITY): Payer: Self-pay | Admitting: Urology

## 2022-05-18 ENCOUNTER — Ambulatory Visit: Payer: 59 | Admitting: Family Medicine

## 2022-05-18 VITALS — BP 132/72 | HR 74 | Temp 98.4°F | Ht 70.0 in | Wt 234.4 lb

## 2022-05-18 DIAGNOSIS — I10 Essential (primary) hypertension: Secondary | ICD-10-CM

## 2022-05-18 DIAGNOSIS — N200 Calculus of kidney: Secondary | ICD-10-CM | POA: Diagnosis not present

## 2022-05-18 DIAGNOSIS — R5382 Chronic fatigue, unspecified: Secondary | ICD-10-CM | POA: Diagnosis not present

## 2022-05-18 NOTE — Progress Notes (Signed)
Subjective:    Patient ID: Christopher Lawson, male    DOB: Feb 26, 1971, 51 y.o.   MRN: [id number]  HPI Recently seen in ER for kidney stone.  I have copied urology (Dr. Felipa Eth) A/P below form my reference:  Christopher Lawson is a 51 y.o. male who is seen for continued evaluation of a left ureteral calculus.  He presented to the emergency room on 04/19/2022 with acute onset of left-sided flank pain.  His symptoms were present for approximately 4 days prior to presentation.  He did have associated nausea without vomiting.  No fevers or chills.  No dysuria or gross hematuria.  He has a prior history of nephrolithiasis, passing a stone in April 2023.  No imaging was performed at that time.  Creatinine normal at 1.22.  White blood cell count slightly elevated 11.4.  Urinalysis unremarkable.  CT urogram showed a 4 mm calculus in the proximal left ureter with mild left hydronephrosis, no other renal calculi. KUB from 04/29/2022 showed a 4 mm calcification at the level of the left L3 transverse process. KUB from 05/06/2022 again showed a calcification over the left L3 transverse process similar position to the prior study.   He returns today for follow-up.  He has not had any flank pain since his visit on 04/29/2022.  He has not passed a stone and has been straining his urine.  He continues on tamsulosin.  No fevers, chills, nausea, or vomiting.  No dysuria or gross hematuria.  Plan: I personally reviewed the KUB from 05/06/2022 showing a persistent calcification over the left L3 transverse process. I discussed the KUB results with the patient today.  Options for management of the left ureteral calculus discussed including continued attempts at spontaneous passage, shockwave lithotripsy, or ureteroscopic laser lithotripsy.  Risk and benefits of each procedure reviewed.  He would like to proceed with shockwave lithotripsy.   The patient will be scheduled for left ESL at [facility].  Surgical request is placed with  the surgery schedulers and will be scheduled at the patient's/family request.   05/18/22 Underwent ESWL 10/3.  Patient is doing well since they remove the stone.  He denies any flank pain or hematuria.  Blood pressure today is excellent 132/72 however he continues to report severe fatigue.  He states he feels tired no matter how much he sleeps.  He denies snoring heavily.  He denies any witnessed apneic episodes.  We have checked his thyroid we have not checked his B12 and we have not checked his testosterone level.  He also reports decreased hearing.  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  However he states that his ears will pop occasionally and his hearing will improve suggesting eustachian tube dysfunction.  Past Medical History:  Diagnosis Date   Abrasion of right index finger 11/20/2012   Arthritis    left shoulder - to have surgery 12/12/2012   Cellulitis of left lower limb    Dental crown present    Deviated nasal septum 11/2012   Essential (primary) hypertension    Factor V deficiency ([place])    states never had a problem   Fatty (change of) liver, not elsewhere classified    Fatty liver    History of kidney stones    Hypertension    under control with meds., has been on med. x 5 yr.   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   Obesity, unspecified    Pain in  left knee    Pain in right foot    Pain in right shoulder    Sebaceous cyst    Spontaneous rupture of other tendons, right upper arm    Tinnitus, right ear    Unilateral primary osteoarthritis, left knee    Past Surgical History:  Procedure Laterality Date   bicep tendon surgery Right    CYST EXCISION     wrist and back (same surgery)   EXTRACORPOREAL SHOCK WAVE LITHOTRIPSY Left 05/11/2022   Procedure: EXTRACORPOREAL SHOCK WAVE LITHOTRIPSY (ESWL);  Surgeon: Cleon Gustin, MD;  Location: [facility];  Service: Urology;  Laterality: Left;   INGUINAL HERNIA REPAIR Right    left  shoulder surgery     NASAL SEPTOPLASTY W/ TURBINOPLASTY Bilateral 11/27/2012   Procedure: NASAL SEPTOPLASTY WITH BILATERAL TURBINATE RESECTION;  Surgeon: Ascencion Dike, MD;  Location: [place];  Service: ENT;  Laterality: Bilateral;   UMBILICAL HERNIA REPAIR N/A 01/29/2022   Procedure: HERNIA REPAIR UMBILICAL ADULT WITH MESH;  Surgeon: Virl Cagey, MD;  Location: [facility];  Service: General;  Laterality: N/A;   WISDOM TOOTH EXTRACTION     Current Outpatient Medications on File Prior to Visit  Medication Sig Dispense Refill   amLODipine (NORVASC) 10 MG tablet TAKE TABLET BY MOUTH ONCE DAILY 90 tablet 3   enalapril (VASOTEC) 10 MG tablet TAKE 2 TABLETS BY MOUTH  DAILY (Patient taking differently: Take 10 mg by mouth daily.) 180 tablet 0   ibuprofen (ADVIL) 200 MG tablet Take 200-400 mg by mouth every 6 (six) hours as needed for moderate pain.     Omega-3 Fatty Acids (FISH OIL PO) Take 1 capsule by mouth daily.     ondansetron (ZOFRAN) 4 MG tablet Take 1 tablet (4 mg total) by mouth daily as needed for nausea or vomiting. 30 tablet 1   oxyCODONE (ROXICODONE) 5 MG immediate release tablet Take 1 tablet (5 mg total) by mouth every 4 (four) hours as needed for severe pain. 30 tablet 0   tamsulosin (FLOMAX) 0.4 MG CAPS capsule Take 1 capsule (0.4 mg total) by mouth daily after supper. 30 capsule 1   No current facility-administered medications on file prior to visit.   No Known Allergies  Social History   Socioeconomic History   Marital status: Married    Spouse name: Not on file   Number of children: Not on file   Years of education: Not on file   Highest education level: Not on file  Occupational History   Not on file  Tobacco Use   Smoking status: Never   Smokeless tobacco: Never  Vaping Use   Vaping Use: Never used  Substance and Sexual Activity   Alcohol use: Yes    Comment: occasionally   Drug use: No   Sexual activity: Yes  Other Topics Concern   Not on  file  Social History Narrative   Not on file   Social Determinants of Health   Financial Resource Strain: Not on file  Food Insecurity: Not on file  Transportation Needs: Not on file  Physical Activity: Not on file  Stress: Not on file  Social Connections: Not on file  Intimate Partner Violence: Not on file      Review of Systems  All other systems reviewed and are negative.      Objective:   Physical Exam Vitals reviewed.  Constitutional:      Appearance: Normal appearance. He is normal weight.  HENT:  Right Ear: Tympanic membrane and ear canal normal.     Left Ear: Tympanic membrane and ear canal normal.     Nose: Nose normal. No congestion or rhinorrhea.     Mouth/Throat:     Mouth: Mucous membranes are moist.     Pharynx: Oropharynx is clear. No oropharyngeal exudate or posterior oropharyngeal erythema.  Eyes:     Conjunctiva/sclera: Conjunctivae normal.     Pupils: Pupils are equal, round, and reactive to light.  Cardiovascular:     Rate and Rhythm: Normal rate and regular rhythm.     Heart sounds: Normal heart sounds. No murmur heard.    No friction rub. No gallop.  Pulmonary:     Effort: Pulmonary effort is normal. No respiratory distress.     Breath sounds: Normal breath sounds. No wheezing, rhonchi or rales.  Abdominal:     General: Abdomen is flat. Bowel sounds are normal. There is no distension.     Palpations: Abdomen is soft.     Tenderness: There is no abdominal tenderness. There is no right CVA tenderness, left CVA tenderness, guarding or rebound.  Musculoskeletal:     Cervical back: Normal range of motion.  Lymphadenopathy:     Cervical: No cervical adenopathy.  Skin:    Coloration: Skin is not jaundiced.     Findings: No erythema or rash.  Neurological:     Mental Status: He is alert.           Assessment & Plan:  Primary hypertension - Plan: CBC with Differential/Platelet, COMPLETE METABOLIC PANEL WITH GFR, Testosterone  Total,Free,Bio, Males, Vitamin [id number], BASIC METABOLIC PANEL WITH GFR  Kidney stone - Plan: CBC with Differential/Platelet, COMPLETE METABOLIC PANEL WITH GFR Patient's exam today is unremarkable.  I will check a CBC, CMP and testosterone level B12 to evaluate his fatigue.  If his labs are normal I would recommend a sleep study.  Obviously if there are abnormalities on his labs I will treat the abnormality.  I recommended trying Xyzal and Nasacort for eustachian tube dysfunction.  If not improving I would recommend an ENT consultation

## 2022-05-19 LAB — CBC WITH DIFFERENTIAL/PLATELET
Absolute Monocytes: 599 cells/uL (ref 200–950)
Basophils Absolute: 33 cells/uL (ref 0–200)
Basophils Relative: 0.4 %
Eosinophils Absolute: 377 cells/uL (ref 15–500)
Eosinophils Relative: 4.6 %
HCT: 42.5 % (ref 38.5–50.0)
Hemoglobin: 14.7 g/dL (ref 13.2–17.1)
Lymphs Abs: 1993 cells/uL (ref 850–3900)
MCH: 31.4 pg (ref 27.0–33.0)
MCHC: 34.6 g/dL (ref 32.0–36.0)
MCV: 90.8 fL (ref 80.0–100.0)
MPV: 10.6 fL (ref 7.5–12.5)
Monocytes Relative: 7.3 %
Neutro Abs: 5199 cells/uL (ref 1500–7800)
Neutrophils Relative %: 63.4 %
Platelets: 217 10*3/uL (ref 140–400)
RBC: 4.68 10*6/uL (ref 4.20–5.80)
RDW: 12.3 % (ref 11.0–15.0)
Total Lymphocyte: 24.3 %
WBC: 8.2 10*3/uL (ref 3.8–10.8)

## 2022-05-19 LAB — COMPLETE METABOLIC PANEL WITH GFR
AG Ratio: 1.8 (calc) (ref 1.0–2.5)
ALT: 56 U/L — ABNORMAL HIGH (ref 9–46)
AST: 27 U/L (ref 10–35)
Albumin: 4.6 g/dL (ref 3.6–5.1)
Alkaline phosphatase (APISO): 96 U/L (ref 35–144)
BUN: 15 mg/dL (ref 7–25)
CO2: 29 mmol/L (ref 20–32)
Calcium: 9.4 mg/dL (ref 8.6–10.3)
Chloride: 108 mmol/L (ref 98–110)
Creat: 0.97 mg/dL (ref 0.70–1.30)
Globulin: 2.5 g/dL (calc) (ref 1.9–3.7)
Glucose, Bld: 82 mg/dL (ref 65–99)
Potassium: 4.6 mmol/L (ref 3.5–5.3)
Sodium: 143 mmol/L (ref 135–146)
Total Bilirubin: 0.4 mg/dL (ref 0.2–1.2)
Total Protein: 7.1 g/dL (ref 6.1–8.1)
eGFR: 95 mL/min/{1.73_m2} (ref >=60)

## 2022-05-19 LAB — TESTOSTERONE TOTAL,FREE,BIO, MALES
Albumin: 4.6 g/dL (ref 3.6–5.1)
Sex Hormone Binding: 41 nmol/L (ref 10–50)
Testosterone, Bioavailable: 67.6 ng/dL — ABNORMAL LOW (ref 110.0–575.0)
Testosterone, Free: 32.2 pg/mL — ABNORMAL LOW (ref 46.0–224.0)
Testosterone: 305 ng/dL (ref 250–827)

## 2022-05-19 LAB — VITAMIN B12: Vitamin B-12: 569 pg/mL (ref 200–1100)

## 2022-05-20 ENCOUNTER — Other Ambulatory Visit: Payer: Self-pay

## 2022-05-20 ENCOUNTER — Encounter: Payer: Self-pay | Admitting: Family Medicine

## 2022-05-20 MED ORDER — TESTOSTERONE CYPIONATE 200 MG/ML IM SOLN: 200.0000 mg | INTRAMUSCULAR | 5 refills | Status: DC

## 2022-05-24 ENCOUNTER — Ambulatory Visit (HOSPITAL_COMMUNITY)
Admission: RE | Admit: 2022-05-24 | Discharge: 2022-05-24 | Disposition: A | Discharge status: Home / Self Care | Payer: 59 | Source: Ambulatory Visit | Attending: Urology | Admitting: Urology

## 2022-05-24 DIAGNOSIS — N201 Calculus of ureter: Secondary | ICD-10-CM

## 2022-05-25 ENCOUNTER — Ambulatory Visit: Payer: 59 | Admitting: Urology

## 2022-05-25 ENCOUNTER — Encounter: Payer: Self-pay | Admitting: Urology

## 2022-05-25 VITALS — BP 139/88 | HR 92 | Ht 70.0 in | Wt 234.0 lb

## 2022-05-25 DIAGNOSIS — N201 Calculus of ureter: Secondary | ICD-10-CM

## 2022-05-25 NOTE — Progress Notes (Signed)
Assessment: 1. Ureteral calculus, left     Plan: I personally viewed the KUB study from 05/24/2022 with results as noted below.  Study confirms excellent fragmentation of the left ureteral calculus. Stone sent for analysis. Stone prevention discussed and information provided. Return to office in 4-6 weeks with previsit renal ultrasound.  Chief Complaint:  Chief Complaint  Patient presents with   Routine Post Op    History of Present Illness:  Christopher Lawson is a 51 y.o. male who is seen for continued evaluation of a left ureteral calculus.  He presented to the emergency room on 04/19/2022 with acute onset of left-sided flank pain.  His symptoms were present for approximately 4 days prior to presentation.  He did have associated nausea without vomiting.  No fevers or chills.  No dysuria or gross hematuria.  He has a prior history of nephrolithiasis, passing a stone in April 2023.  No imaging was performed at that time.  Creatinine normal at 1.22.  White blood cell count slightly elevated 11.4.  Urinalysis unremarkable.  CT urogram showed a 4 mm calculus in the proximal left ureter with mild left hydronephrosis, no other renal calculi. KUB from 04/29/2022 showed a 4 mm calcification at the level of the left L3 transverse process. KUB from 05/06/2022 again showed a calcification over the left L3 transverse process similar position to the prior study. He underwent left ESL on 05/11/2022. KUB from 05/24/2022 showed no obvious calcification in the area of the left mid ureter. He has done well since the procedure.  He has passed some small stone fragments without difficulty.  No left-sided flank pain.  No dysuria or gross hematuria.  Portions of the above documentation were copied from a prior visit for review purposes only.   Past Medical History:  Past Medical History:  Diagnosis Date   Abrasion of right index finger 11/20/2012   Arthritis    left shoulder - to have surgery 12/12/2012    Cellulitis of left lower limb    Dental crown present    Deviated nasal septum 11/2012   Essential (primary) hypertension    Factor V deficiency (HCC)    states never had a problem   Fatty (change of) liver, not elsewhere classified    Fatty liver    History of kidney stones    Hypertension    under control with meds., has been on med. x 5 yr.   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   Obesity, unspecified    Pain in left knee    Pain in right foot    Pain in right shoulder    Sebaceous cyst    Spontaneous rupture of other tendons, right upper arm    Tinnitus, right ear    Unilateral primary osteoarthritis, left knee     Past Surgical History:  Past Surgical History:  Procedure Laterality Date   bicep tendon surgery Right    CYST EXCISION     wrist and back (same surgery)   EXTRACORPOREAL SHOCK WAVE LITHOTRIPSY Left 05/11/2022   Procedure: EXTRACORPOREAL SHOCK WAVE LITHOTRIPSY (ESWL);  Surgeon: Malen Gauze, MD;  Location: [facility];  Service: Urology;  Laterality: Left;   INGUINAL HERNIA REPAIR Right    left shoulder surgery     NASAL SEPTOPLASTY W/ TURBINOPLASTY Bilateral 11/27/2012   Procedure: NASAL SEPTOPLASTY WITH BILATERAL TURBINATE RESECTION;  Surgeon: Darletta Moll, MD;  Location: [facility];  Service: ENT;  Laterality: Bilateral;   UMBILICAL HERNIA  REPAIR N/A 01/29/2022   Procedure: HERNIA REPAIR UMBILICAL ADULT WITH MESH;  Surgeon: Virl Cagey, MD;  Location: [facility];  Service: General;  Laterality: N/A;   WISDOM TOOTH EXTRACTION      Allergies:  No Known Allergies  Family History:  No family history on file.  Social History:  Social History   Tobacco Use   Smoking status: Never   Smokeless tobacco: Never  Vaping Use   Vaping Use: Never used  Substance Use Topics   Alcohol use: Yes    Comment: occasionally   Drug use: No    ROS: Constitutional:  Negative for fever, chills, weight loss CV: Negative for chest pain,  previous MI, hypertension Respiratory:  Negative for shortness of breath, wheezing, sleep apnea, frequent cough GI:  Negative for nausea, vomiting, bloody stool, GERD  Physical exam: BP 139/88   Pulse 92   Ht 5\' 10"  (1.778 m)   Wt 234 lb (106.1 kg)   BMI 33.58 kg/m  GENERAL APPEARANCE:  Well appearing, well developed, well nourished, NAD HEENT:  Atraumatic, normocephalic, oropharynx clear NECK:  Supple without lymphadenopathy or thyromegaly ABDOMEN:  Soft, non-tender, no masses EXTREMITIES:  Moves all extremities well, without clubbing, cyanosis, or edema NEUROLOGIC:  Alert and oriented x 3, normal gait, CN II-XII grossly intact MENTAL STATUS:  appropriate BACK:  Non-tender to palpation, No CVAT SKIN:  Warm, dry, and intact  Results: U/A: 0-2 RBC, few bacteria

## 2022-05-26 LAB — URINALYSIS, ROUTINE W REFLEX MICROSCOPIC
Bilirubin, UA: NEGATIVE
Glucose, UA: NEGATIVE
Ketones, UA: NEGATIVE
Leukocytes,UA: NEGATIVE
Nitrite, UA: NEGATIVE
Protein,UA: NEGATIVE
RBC, UA: NEGATIVE
Specific Gravity, UA: 1.015 (ref 1.005–1.030)
Urobilinogen, Ur: 2 mg/dL — ABNORMAL HIGH (ref 0.2–1.0)
pH, UA: 8.5 — ABNORMAL HIGH (ref 5.0–7.5)

## 2022-05-26 LAB — MICROSCOPIC EXAMINATION
Epithelial Cells (non renal): NONE SEEN /hpf (ref 0–10)
WBC, UA: NONE SEEN /hpf (ref 0–5)

## 2022-06-01 ENCOUNTER — Encounter: Payer: Self-pay | Admitting: Urology

## 2022-06-01 LAB — CALCULI, WITH PHOTOGRAPH (CLINICAL LAB)
Calcium Oxalate Dihydrate: 20 %
Calcium Oxalate Monohydrate: 70 %
Hydroxyapatite: 10 %
Weight Calculi: 8 mg

## 2022-06-03 ENCOUNTER — Other Ambulatory Visit: Payer: Self-pay | Admitting: Family Medicine

## 2022-06-04 NOTE — Telephone Encounter (Signed)
Requested medications are due for refill today.  yes  Requested medications are on the active medications list.  yes  Last refill. 12/10/2021 #180 0 rf  Future visit scheduled.   No - last seen in Oct 2023  Notes to clinic.  To pharmacy: Please send a replace/new response with 90-Day Supply if appropriate to maximize member benefit. 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   [place] Susy Frizzle, MD   1 year ago Neck pain   [place] Susy Frizzle, MD   1 year ago Primary hypertension   [place], Cammie Mcgee, MD       Future Appointments             In 2 weeks [place], Reece Leader., MD [facility]            Passed - Cr in normal range and within 180 days    Creat  Date Value Ref Range Status  05/18/2022 0.97 0.70 - 1.30 mg/dL Final         Passed - K in normal range and within 180 days    Potassium  Date Value Ref Range Status  05/18/2022 4.6 3.5 - 5.3 mmol/L Final         Passed - Patient is not pregnant      Passed - Last BP in normal range    BP Readings from Last 1 Encounters:  05/25/22 139/88

## 2022-06-18 ENCOUNTER — Ambulatory Visit (HOSPITAL_COMMUNITY)
Admission: RE | Admit: 2022-06-18 | Discharge: 2022-06-18 | Disposition: A | Discharge status: Home / Self Care | Payer: 59 | Source: Ambulatory Visit | Attending: Urology | Admitting: Urology

## 2022-06-18 DIAGNOSIS — N201 Calculus of ureter: Secondary | ICD-10-CM | POA: Diagnosis present

## 2022-06-22 ENCOUNTER — Ambulatory Visit: Payer: 59 | Admitting: Urology

## 2022-06-22 ENCOUNTER — Encounter: Payer: Self-pay | Admitting: Urology

## 2022-06-22 VITALS — BP 169/93 | HR 72 | Ht 70.0 in | Wt 234.0 lb

## 2022-06-22 DIAGNOSIS — N201 Calculus of ureter: Secondary | ICD-10-CM

## 2022-06-22 NOTE — Progress Notes (Signed)
Assessment: 1. Ureteral calculus, left     Plan: I personally reviewed the results of the renal ultrasound and stone analysis.  Results discussed with the patient today. Stone prevention discussed and information provided. Return to office prn  Chief Complaint:  Chief Complaint  Patient presents with   ureteral calculus    History of Present Illness:  Christopher Lawson is a 51 y.o. male who is seen for continued evaluation of a left ureteral calculus.  He presented to the emergency room on 04/19/2022 with acute onset of left-sided flank pain.  His symptoms were present for approximately 4 days prior to presentation.  He did have associated nausea without vomiting.  No fevers or chills.  No dysuria or gross hematuria.  He has a prior history of nephrolithiasis, passing a stone in April 2023.  No imaging was performed at that time.  Creatinine normal at 1.22.  White blood cell count slightly elevated 11.4.  Urinalysis unremarkable.  CT urogram showed a 4 mm calculus in the proximal left ureter with mild left hydronephrosis, no other renal calculi. KUB from 04/29/2022 showed a 4 mm calcification at the level of the left L3 transverse process. KUB from 05/06/2022 again showed a calcification over the left L3 transverse process similar position to the prior study. He underwent left ESL on 05/11/2022. KUB from 05/24/2022 showed no obvious calcification in the area of the left mid ureter. He has done well since the procedure.  He has passed some small stone fragments without difficulty.  No left-sided flank pain.  No dysuria or gross hematuria. Stone analysis: 70% calcium oxalate monohydrate, 20% calcium oxalate dihydrate, 10% hydroxyapatite. Renal U/S from 06/18/2022 showed no evidence of renal mass or hydronephrosis and bilateral ureteral jets.  He returns today for follow-up.  He is doing well.  No flank pain.  No dysuria or gross hematuria.  Portions of the above documentation were copied from  a prior visit for review purposes only.   Past Medical History:  Past Medical History:  Diagnosis Date   Abrasion of right index finger 11/20/2012   Arthritis    left shoulder - to have surgery 12/12/2012   Cellulitis of left lower limb    Dental crown present    Deviated nasal septum 11/2012   Essential (primary) hypertension    Factor V deficiency (HCC)    states never had a problem   Fatty (change of) liver, not elsewhere classified    Fatty liver    History of kidney stones    Hypertension    under control with meds., has been on med. x 5 yr.   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   Obesity, unspecified    Pain in left knee    Pain in right foot    Pain in right shoulder    Sebaceous cyst    Spontaneous rupture of other tendons, right upper arm    Tinnitus, right ear    Unilateral primary osteoarthritis, left knee     Past Surgical History:  Past Surgical History:  Procedure Laterality Date   bicep tendon surgery Right    CYST EXCISION     wrist and back (same surgery)   EXTRACORPOREAL SHOCK WAVE LITHOTRIPSY Left 05/11/2022   Procedure: EXTRACORPOREAL SHOCK WAVE LITHOTRIPSY (ESWL);  Surgeon: Malen Gauze, MD;  Location: [facility];  Service: Urology;  Laterality: Left;   INGUINAL HERNIA REPAIR Right    left shoulder surgery     NASAL SEPTOPLASTY W/  TURBINOPLASTY Bilateral 11/27/2012   Procedure: NASAL SEPTOPLASTY WITH BILATERAL TURBINATE RESECTION;  Surgeon: Darletta Moll, MD;  Location: [facility];  Service: ENT;  Laterality: Bilateral;   UMBILICAL HERNIA REPAIR N/A 01/29/2022   Procedure: HERNIA REPAIR UMBILICAL ADULT WITH MESH;  Surgeon: Lucretia Roers, MD;  Location: [facility];  Service: General;  Laterality: N/A;   WISDOM TOOTH EXTRACTION      Allergies:  No Known Allergies  Family History:  No family history on file.  Social History:  Social History   Tobacco Use   Smoking status: Never   Smokeless tobacco: Never  Vaping  Use   Vaping Use: Never used  Substance Use Topics   Alcohol use: Yes    Comment: occasionally   Drug use: No    ROS: Constitutional:  Negative for fever, chills, weight loss CV: Negative for chest pain, previous MI, hypertension Respiratory:  Negative for shortness of breath, wheezing, sleep apnea, frequent cough GI:  Negative for nausea, vomiting, bloody stool, GERD  Physical exam: BP (!) 169/93   Pulse 72   Ht 5\' 10"  (1.778 m)   Wt 234 lb (106.1 kg)   BMI 33.58 kg/m  GENERAL APPEARANCE:  Well appearing, well developed, well nourished, NAD HEENT:  Atraumatic, normocephalic, oropharynx clear NECK:  Supple without lymphadenopathy or thyromegaly ABDOMEN:  Soft, non-tender, no masses EXTREMITIES:  Moves all extremities well, without clubbing, cyanosis, or edema NEUROLOGIC:  Alert and oriented x 3, normal gait, CN II-XII grossly intact MENTAL STATUS:  appropriate BACK:  Non-tender to palpation, No CVAT SKIN:  Warm, dry, and intact  Results: U/A: Dipstick negative

## 2022-06-23 LAB — URINALYSIS, ROUTINE W REFLEX MICROSCOPIC
Bilirubin, UA: NEGATIVE
Glucose, UA: NEGATIVE
Leukocytes,UA: NEGATIVE
Nitrite, UA: NEGATIVE
Protein,UA: NEGATIVE
RBC, UA: NEGATIVE
Specific Gravity, UA: 1.02 (ref 1.005–1.030)
Urobilinogen, Ur: 4 mg/dL — ABNORMAL HIGH (ref 0.2–1.0)
pH, UA: 7 (ref 5.0–7.5)

## 2022-06-29 ENCOUNTER — Ambulatory Visit (INDEPENDENT_AMBULATORY_CARE_PROVIDER_SITE_OTHER): Payer: 59 | Admitting: Family Medicine

## 2022-06-29 ENCOUNTER — Encounter: Payer: Self-pay | Admitting: Family Medicine

## 2022-06-29 VITALS — BP 140/90 | HR 89 | Temp 98.6°F | Ht 70.0 in | Wt 234.0 lb

## 2022-06-29 DIAGNOSIS — M654 Radial styloid tenosynovitis [de Quervain]: Secondary | ICD-10-CM

## 2022-06-29 DIAGNOSIS — M79641 Pain in right hand: Secondary | ICD-10-CM

## 2022-06-29 NOTE — Assessment & Plan Note (Addendum)
Patient is experiencing pain localized to radial side of right wrist that is worsened with radial deviation of wrist. Positive Eichhoff and Finklestein's tests. Encouraged spica splint use for as many hours of the day as tolerated for 6 weeks. May try corticosteroid injections. Continue Ibuprofen OTC. Avoid activities that exacerbate pain.

## 2022-06-29 NOTE — Progress Notes (Signed)
Acute Office Visit  Subjective:     Patient ID: Christopher Lawson, male    DOB: [birth year], 51 y.o.   MRN: [id number]  Chief Complaint  Patient presents with   Follow-up    right hand pain    HPI Patient is in today for right hand pain of thumb and first finger radiating up his arm for about a week or so. Reports it "feels weird" like a "sharp shooting pain" when he extends his thumb. No pain with gripping. Has tried ibuprofen with some relief. No acute trauma or injury.  Review of Systems  Reason unable to perform ROS: see HPI.  All other systems reviewed and are negative.   Past Medical History:  Diagnosis Date   Abrasion of right index finger 11/20/2012   Arthritis    left shoulder - to have surgery 12/12/2012   Cellulitis of left lower limb    Dental crown present    Deviated nasal septum 11/2012   Essential (primary) hypertension    Factor V deficiency (HCC)    states never had a problem   Fatty (change of) liver, not elsewhere classified    Fatty liver    History of kidney stones    Hypertension    under control with meds., has been on med. x 5 yr.   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   Obesity, unspecified    Pain in left knee    Pain in right foot    Pain in right shoulder    Sebaceous cyst    Spontaneous rupture of other tendons, right upper arm    Tinnitus, right ear    Unilateral primary osteoarthritis, left knee    Past Surgical History:  Procedure Laterality Date   bicep tendon surgery Right    CYST EXCISION     wrist and back (same surgery)   EXTRACORPOREAL SHOCK WAVE LITHOTRIPSY Left 05/11/2022   Procedure: EXTRACORPOREAL SHOCK WAVE LITHOTRIPSY (ESWL);  Surgeon: Malen Gauze, MD;  Location: [facility];  Service: Urology;  Laterality: Left;   INGUINAL HERNIA REPAIR Right    left shoulder surgery     NASAL SEPTOPLASTY W/ TURBINOPLASTY Bilateral 11/27/2012   Procedure: NASAL SEPTOPLASTY WITH BILATERAL TURBINATE RESECTION;  Surgeon:  Darletta Moll, MD;  Location: [facility];  Service: ENT;  Laterality: Bilateral;   UMBILICAL HERNIA REPAIR N/A 01/29/2022   Procedure: HERNIA REPAIR UMBILICAL ADULT WITH MESH;  Surgeon: Lucretia Roers, MD;  Location: [facility];  Service: General;  Laterality: N/A;   WISDOM TOOTH EXTRACTION     Current Outpatient Medications on File Prior to Visit  Medication Sig Dispense Refill   amLODipine (NORVASC) 10 MG tablet TAKE TABLET BY MOUTH ONCE DAILY 90 tablet 3   ibuprofen (ADVIL) 200 MG tablet Take 200-400 mg by mouth every 6 (six) hours as needed for moderate pain.     Omega-3 Fatty Acids (FISH OIL PO) Take 1 capsule by mouth daily.     enalapril (VASOTEC) 10 MG tablet TAKE 2 TABLETS BY MOUTH DAILY (Patient not taking: Reported on 06/29/2022) 180 tablet 3   ondansetron (ZOFRAN) 4 MG tablet Take 1 tablet (4 mg total) by mouth daily as needed for nausea or vomiting. (Patient not taking: Reported on 06/29/2022) 30 tablet 1   oxyCODONE (ROXICODONE) 5 MG immediate release tablet Take 1 tablet (5 mg total) by mouth every 4 (four) hours as needed for severe pain. (Patient not taking: Reported on 06/29/2022) 30 tablet 0  tamsulosin (FLOMAX) 0.4 MG CAPS capsule Take 1 capsule (0.4 mg total) by mouth daily after supper. (Patient not taking: Reported on 06/29/2022) 30 capsule 1   testosterone cypionate (DEPO-TESTOSTERONE) 200 MG/ML injection Inject 1 mL (200 mg total) into the muscle every 14 (fourteen) days. (Patient not taking: Reported on 06/29/2022) 10 mL 5   No current facility-administered medications on file prior to visit.   No Known Allergies     Objective:    BP (!) 140/90   Pulse 89   Temp 98.6 F (37 C) (Oral)   Ht 5\' 10"  (1.778 m)   Wt 234 lb (106.1 kg)   SpO2 99%   BMI 33.58 kg/m    Physical Exam Vitals and nursing note reviewed.  Constitutional:      Appearance: Normal appearance. He is normal weight.  HENT:     Head: Normocephalic and atraumatic.   Musculoskeletal:     Right wrist: Tenderness present. No swelling or deformity. Decreased range of motion.  Skin:    General: Skin is warm and dry.     Capillary Refill: Capillary refill takes less than 2 seconds.  Neurological:     General: No focal deficit present.     Mental Status: He is alert and oriented to person, place, and time. Mental status is at baseline.  Psychiatric:        Mood and Affect: Mood normal.        Behavior: Behavior normal.        Thought Content: Thought content normal.        Judgment: Judgment normal.     No results found for any visits on 06/29/22.      Assessment & Plan:   Problem List Items Addressed This Visit       Musculoskeletal and Integument   De Quervain's tenosynovitis, right    Patient is experiencing pain localized to radial side of right wrist that is worsened with radial deviation of wrist. Positive Eichhoff and Finklestein's tests. Encouraged spica splint use for as many hours of the day as tolerated for 6 weeks. May try corticosteroid injections. Continue Ibuprofen OTC. Avoid activities that exacerbate pain.      Other Visit Diagnoses     Right hand pain    -  Primary       No orders of the defined types were placed in this encounter.   Return if symptoms worsen or fail to improve.  07/01/22, FNP

## 2022-06-29 NOTE — Patient Instructions (Signed)
Use the Spica splint 24 hours a day for 3-8 weeks Continue NSAIDs over the counter

## 2022-07-09 ENCOUNTER — Encounter: Payer: Self-pay | Admitting: Family Medicine

## 2022-07-23 ENCOUNTER — Ambulatory Visit (INDEPENDENT_AMBULATORY_CARE_PROVIDER_SITE_OTHER): Payer: 59 | Admitting: Family Medicine

## 2022-07-23 ENCOUNTER — Ambulatory Visit: Payer: 59 | Admitting: Family Medicine

## 2022-07-23 ENCOUNTER — Encounter: Payer: Self-pay | Admitting: Family Medicine

## 2022-07-23 VITALS — BP 138/78 | HR 83 | Ht 70.0 in | Wt 235.4 lb

## 2022-07-23 DIAGNOSIS — E291 Testicular hypofunction: Secondary | ICD-10-CM

## 2022-07-23 MED ORDER — TESTOSTERONE CYPIONATE 100 MG/ML IM SOLN: 200.0000 mg | INTRAMUSCULAR | 1 refills | Status: DC

## 2022-07-23 NOTE — Progress Notes (Signed)
Subjective:    Patient ID: Christopher Lawson, male    DOB: 1971-06-08, 51 y.o.   MRN: [id number]  HPI Patient is here today for follow-up of hypogonadism.  He is currently on testosterone replacement 200 mg IM every 2 weeks.  He has been doing so since October.  He is due to the lab work.  There is no evidence of a ruddy complexion.  He denies any chest pain or shortness of breath.  He denies any dysuria or urgency or frequency or urinary retention.  He feels better since starting the testosterone.  His fatigue has improved.  His blood pressure today is acceptable.  Past Medical History:  Diagnosis Date   Abrasion of right index finger 11/20/2012   Arthritis    left shoulder - to have surgery 12/12/2012   Cellulitis of left lower limb    Dental crown present    Deviated nasal septum 11/2012   Essential (primary) hypertension    Factor V deficiency (HCC)    states never had a problem   Fatty (change of) liver, not elsewhere classified    Fatty liver    History of kidney stones    Hypertension    under control with meds., has been on med. x 5 yr.   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   Obesity, unspecified    Pain in left knee    Pain in right foot    Pain in right shoulder    Sebaceous cyst    Spontaneous rupture of other tendons, right upper arm    Tinnitus, right ear    Unilateral primary osteoarthritis, left knee    Past Surgical History:  Procedure Laterality Date   bicep tendon surgery Right    CYST EXCISION     wrist and back (same surgery)   EXTRACORPOREAL SHOCK WAVE LITHOTRIPSY Left 05/11/2022   Procedure: EXTRACORPOREAL SHOCK WAVE LITHOTRIPSY (ESWL);  Surgeon: Malen Gauze, MD;  Location: [facility];  Service: Urology;  Laterality: Left;   INGUINAL HERNIA REPAIR Right    left shoulder surgery     NASAL SEPTOPLASTY W/ TURBINOPLASTY Bilateral 11/27/2012   Procedure: NASAL SEPTOPLASTY WITH BILATERAL TURBINATE RESECTION;  Surgeon: Darletta Moll, MD;  Location:  [facility];  Service: ENT;  Laterality: Bilateral;   UMBILICAL HERNIA REPAIR N/A 01/29/2022   Procedure: HERNIA REPAIR UMBILICAL ADULT WITH MESH;  Surgeon: Lucretia Roers, MD;  Location: [facility];  Service: General;  Laterality: N/A;   WISDOM TOOTH EXTRACTION     Current Outpatient Medications on File Prior to Visit  Medication Sig Dispense Refill   amLODipine (NORVASC) 10 MG tablet TAKE TABLET BY MOUTH ONCE DAILY 90 tablet 3   ibuprofen (ADVIL) 200 MG tablet Take 200-400 mg by mouth every 6 (six) hours as needed for moderate pain.     Omega-3 Fatty Acids (FISH OIL PO) Take 1 capsule by mouth daily.     No current facility-administered medications on file prior to visit.   No Known Allergies  Social History   Socioeconomic History   Marital status: Married    Spouse name: Not on file   Number of children: Not on file   Years of education: Not on file   Highest education level: Not on file  Occupational History   Not on file  Tobacco Use   Smoking status: Never   Smokeless tobacco: Never  Vaping Use   Vaping Use: Never used  Substance and Sexual Activity  Alcohol use: Yes    Comment: occasionally   Drug use: No   Sexual activity: Yes  Other Topics Concern   Not on file  Social History Narrative   Not on file   Social Determinants of Health   Financial Resource Strain: Not on file  Food Insecurity: Not on file  Transportation Needs: Not on file  Physical Activity: Not on file  Stress: Not on file  Social Connections: Not on file  Intimate Partner Violence: Not on file      Review of Systems  All other systems reviewed and are negative.      Objective:   Physical Exam Vitals reviewed.  Constitutional:      Appearance: Normal appearance. He is normal weight.  HENT:     Right Ear: Tympanic membrane and ear canal normal.     Left Ear: Tympanic membrane and ear canal normal.     Nose: Nose normal. No congestion or rhinorrhea.      Mouth/Throat:     Mouth: Mucous membranes are moist.     Pharynx: Oropharynx is clear. No oropharyngeal exudate or posterior oropharyngeal erythema.  Eyes:     Conjunctiva/sclera: Conjunctivae normal.     Pupils: Pupils are equal, round, and reactive to light.  Cardiovascular:     Rate and Rhythm: Normal rate and regular rhythm.     Heart sounds: Normal heart sounds. No murmur heard.    No friction rub. No gallop.  Pulmonary:     Effort: Pulmonary effort is normal. No respiratory distress.     Breath sounds: Normal breath sounds. No wheezing, rhonchi or rales.  Abdominal:     General: Abdomen is flat. Bowel sounds are normal. There is no distension.     Palpations: Abdomen is soft.     Tenderness: There is no abdominal tenderness. There is no right CVA tenderness, left CVA tenderness, guarding or rebound.  Musculoskeletal:     Cervical back: Normal range of motion.  Lymphadenopathy:     Cervical: No cervical adenopathy.  Skin:    Coloration: Skin is not jaundiced.     Findings: No erythema or rash.  Neurological:     Mental Status: He is alert.           Assessment & Plan:  Hypogonadism in male - Plan: CBC with Differential/Platelet, COMPLETE METABOLIC PANEL WITH GFR, PSA I will check a PSA to ensure there is not been growth in his PSA since starting testosterone.  I will check a CBC to ensure that there is no polycythemia.  Symptomatically the patient is improving so assuming no adverse effects on his lab work we will continue testosterone replacement 200 mg IM every 2 weeks.

## 2022-07-24 LAB — COMPLETE METABOLIC PANEL WITH GFR
AG Ratio: 1.8 (calc) (ref 1.0–2.5)
ALT: 47 U/L — ABNORMAL HIGH (ref 9–46)
AST: 27 U/L (ref 10–35)
Albumin: 4.4 g/dL (ref 3.6–5.1)
Alkaline phosphatase (APISO): 88 U/L (ref 35–144)
BUN: 12 mg/dL (ref 7–25)
CO2: 28 mmol/L (ref 20–32)
Calcium: 9.4 mg/dL (ref 8.6–10.3)
Chloride: 106 mmol/L (ref 98–110)
Creat: 1.01 mg/dL (ref 0.70–1.30)
Globulin: 2.4 g/dL (calc) (ref 1.9–3.7)
Glucose, Bld: 99 mg/dL (ref 65–99)
Potassium: 4.1 mmol/L (ref 3.5–5.3)
Sodium: 142 mmol/L (ref 135–146)
Total Bilirubin: 0.7 mg/dL (ref 0.2–1.2)
Total Protein: 6.8 g/dL (ref 6.1–8.1)
eGFR: 90 mL/min/{1.73_m2} (ref >=60)

## 2022-07-24 LAB — CBC WITH DIFFERENTIAL/PLATELET
Absolute Monocytes: 473 cells/uL (ref 200–950)
Basophils Absolute: 30 cells/uL (ref 0–200)
Basophils Relative: 0.4 %
Eosinophils Absolute: 353 cells/uL (ref 15–500)
Eosinophils Relative: 4.7 %
HCT: 45 % (ref 38.5–50.0)
Hemoglobin: 15.6 g/dL (ref 13.2–17.1)
Lymphs Abs: 1883 cells/uL (ref 850–3900)
MCH: 31.3 pg (ref 27.0–33.0)
MCHC: 34.7 g/dL (ref 32.0–36.0)
MCV: 90.4 fL (ref 80.0–100.0)
MPV: 10.7 fL (ref 7.5–12.5)
Monocytes Relative: 6.3 %
Neutro Abs: 4763 cells/uL (ref 1500–7800)
Neutrophils Relative %: 63.5 %
Platelets: 205 10*3/uL (ref 140–400)
RBC: 4.98 10*6/uL (ref 4.20–5.80)
RDW: 12.3 % (ref 11.0–15.0)
Total Lymphocyte: 25.1 %
WBC: 7.5 10*3/uL (ref 3.8–10.8)

## 2022-07-24 LAB — PSA: PSA: 0.89 ng/mL (ref <=4.00)

## 2022-08-11 ENCOUNTER — Encounter: Payer: Self-pay | Admitting: Family Medicine

## 2022-08-12 ENCOUNTER — Other Ambulatory Visit: Payer: Self-pay | Admitting: Family Medicine

## 2022-08-13 ENCOUNTER — Other Ambulatory Visit: Payer: Self-pay | Admitting: Family Medicine

## 2022-08-13 MED ORDER — TESTOSTERONE 10 MG/ACT (2%) TD GEL: 4.0000 | Freq: Every day | TRANSDERMAL | 3 refills | Status: DC

## 2022-08-17 ENCOUNTER — Ambulatory Visit: Payer: 59 | Admitting: Orthopaedic Surgery

## 2022-08-20 DIAGNOSIS — R7989 Other specified abnormal findings of blood chemistry: Secondary | ICD-10-CM | POA: Insufficient documentation

## 2022-08-20 DIAGNOSIS — E291 Testicular hypofunction: Secondary | ICD-10-CM | POA: Insufficient documentation

## 2022-08-23 ENCOUNTER — Encounter: Payer: Self-pay | Admitting: Family Medicine

## 2022-08-25 ENCOUNTER — Encounter: Payer: Self-pay | Admitting: Orthopaedic Surgery

## 2022-08-25 ENCOUNTER — Ambulatory Visit: Payer: 59 | Admitting: Orthopaedic Surgery

## 2022-08-25 VITALS — BP 129/88 | HR 72 | Ht 70.0 in | Wt 229.2 lb

## 2022-08-25 DIAGNOSIS — M654 Radial styloid tenosynovitis [de Quervain]: Secondary | ICD-10-CM | POA: Diagnosis not present

## 2022-08-25 DIAGNOSIS — M79644 Pain in right finger(s): Secondary | ICD-10-CM

## 2022-08-25 MED ORDER — METHYLPREDNISOLONE ACETATE 40 MG/ML IJ SUSP
40.0000 mg | Freq: Once | INTRAMUSCULAR | Status: AC
  Administered 2022-08-25: 40 mg via INTRA_ARTICULAR

## 2022-08-25 MED ORDER — NAPROXEN 500 MG PO TABS: 500.0000 mg | ORAL_TABLET | Freq: Two times a day (BID) | ORAL | 5 refills | Status: DC

## 2022-08-25 NOTE — Progress Notes (Signed)
My thumb hurts  He has pain over the right thumb in the first compartment.  He has been seen by his family doctor and used a splint on the thumb but it has not helped.  He works in Theatre manager for [employer].  He denies any trauma.  He has pain over the first compartment of the right dominant thumb.  He had no redness.  He has positive Finkelstein sign.  NV intact.  Encounter Diagnoses  Name Primary?   Pain of right thumb Yes   De Quervain's tenosynovitis, right    Procedure note: After permission from the patient and prep of the right first dorsal compartment, I injected 1% Xylocaine and 1 cc DepoMedrol 40 into the tendon sheath area by sterile technique tolerated well.  I will call in Naprosyn.  Go back into the splint.  Return in two weeks.  Call if any problem.  Precautions discussed.  Electronically Signed Sanjuana Kava, MD 1/17/20248:52 AM

## 2022-09-07 ENCOUNTER — Encounter: Payer: Self-pay | Admitting: Orthopaedic Surgery

## 2022-09-07 ENCOUNTER — Ambulatory Visit: Payer: 59 | Admitting: Orthopaedic Surgery

## 2022-09-07 VITALS — Ht 70.0 in | Wt 235.0 lb

## 2022-09-07 DIAGNOSIS — M654 Radial styloid tenosynovitis [de Quervain]: Secondary | ICD-10-CM

## 2022-09-07 NOTE — Progress Notes (Signed)
I am much better.  He has no pain of the first extensor compartment on the right.  He has full ROM, no pain, NV intact.  Encounter Diagnosis  Name Primary?   De Quervain's tenosynovitis, right Yes   I will see as needed.  Call if any problem.  Precautions discussed.  Electronically Signed Sanjuana Kava, MD 1/30/20243:20 PM

## 2022-09-28 ENCOUNTER — Encounter: Payer: Self-pay | Admitting: Family Medicine

## 2022-10-07 ENCOUNTER — Encounter: Payer: Self-pay | Admitting: Radiology

## 2022-11-03 ENCOUNTER — Telehealth: Payer: 59 | Admitting: Nurse Practitioner

## 2022-11-03 DIAGNOSIS — T148XXA Other injury of unspecified body region, initial encounter: Secondary | ICD-10-CM

## 2022-11-03 NOTE — Progress Notes (Signed)
Christopher Lawson,  After reviewing your chart and imaging I think it is best you are seen in person for evaluation and possibly imaging   I feel your condition warrants further evaluation and I recommend that you be seen in a face to face visit.   NOTE: There will be NO CHARGE for this eVisit   If you are having a true medical emergency please call 911.      For an urgent face to face visit, [place] has eight urgent care centers for your convenience:   NEW!! [place] [place] [facility] [place] Get Driving Directions [id number] [address] [place], [place] [facility] [place] [facility] [place] Get Driving Directions [id number] [place], [place] [address]   [place] [facility] [place] [facility]) Get Driving Directions [id number] [address], [place] [address]  [place] [facility] [place] Get Driving Directions [id number] [address] [place],  [place]  [address]  [place] [facility] [place] [facility] at [address] Get Driving Directions  [phone] [address] [place],  [place] [address]   [facility] Get Driving Directions [id number] [place], [place], [place] [address]   [facility] Get Driving Directions  [id number] [address] [place], [place] [address]   [facility] [place] Get Driving Directions [id number] [address], [place], [place] [address]  Your MyChart E-visit questionnaire answers were reviewed by a board certified advanced clinical practitioner to complete your personal care plan based on your specific symptoms.  Thank you for using e-Visits.

## 2022-11-04 ENCOUNTER — Ambulatory Visit: Payer: 59 | Admitting: Family Medicine

## 2022-11-04 VITALS — BP 136/82 | HR 72 | Temp 98.3°F | Ht 70.0 in | Wt 234.0 lb

## 2022-11-04 DIAGNOSIS — L309 Dermatitis, unspecified: Secondary | ICD-10-CM | POA: Diagnosis not present

## 2022-11-04 MED ORDER — TRIAMCINOLONE ACETONIDE 0.1 % EX CREA: 1.0000 | TOPICAL_CREAM | Freq: Two times a day (BID) | CUTANEOUS | 0 refills | Status: DC

## 2022-11-04 MED ORDER — CEPHALEXIN 500 MG PO CAPS: 500.0000 mg | ORAL_CAPSULE | Freq: Three times a day (TID) | ORAL | 0 refills | Status: DC

## 2022-11-04 NOTE — Progress Notes (Signed)
Subjective:    Patient ID: Christopher Lawson, male    DOB: July 30, 1971, 52 y.o.   MRN: [id number]  Rash   Patient has a slight patch of erythema on his posterior right lower calf.  It does not itch.  It is a little bit sore.  Has been there for 3 or 4 days.  Is not spreading he does have some pitting edema in that area.  Differential includes atopic dermatitis versus mild cellulitis Past Medical History:  Diagnosis Date   Abrasion of right index finger 11/20/2012   Arthritis    left shoulder - to have surgery 12/12/2012   Cellulitis of left lower limb    Dental crown present    Deviated nasal septum 11/2012   Essential (primary) hypertension    Factor V deficiency ([place])    states never had a problem   Fatty (change of) liver, not elsewhere classified    Fatty liver    History of kidney stones    Hypertension    under control with meds., has been on med. x 5 yr.   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   Obesity, unspecified    Pain in left knee    Pain in right foot    Pain in right shoulder    Sebaceous cyst    Spontaneous rupture of other tendons, right upper arm    Tinnitus, right ear    Unilateral primary osteoarthritis, left knee    Past Surgical History:  Procedure Laterality Date   bicep tendon surgery Right    CYST EXCISION     wrist and back (same surgery)   EXTRACORPOREAL SHOCK WAVE LITHOTRIPSY Left 05/11/2022   Procedure: EXTRACORPOREAL SHOCK WAVE LITHOTRIPSY (ESWL);  Surgeon: Cleon Gustin, MD;  Location: [facility];  Service: Urology;  Laterality: Left;   INGUINAL HERNIA REPAIR Right    left shoulder surgery     NASAL SEPTOPLASTY W/ TURBINOPLASTY Bilateral 11/27/2012   Procedure: NASAL SEPTOPLASTY WITH BILATERAL TURBINATE RESECTION;  Surgeon: Ascencion Dike, MD;  Location: [facility];  Service: ENT;  Laterality: Bilateral;   UMBILICAL HERNIA REPAIR N/A 01/29/2022   Procedure: HERNIA REPAIR UMBILICAL ADULT WITH MESH;  Surgeon: Virl Cagey, MD;  Location: [facility];  Service: General;  Laterality: N/A;   WISDOM TOOTH EXTRACTION     Current Outpatient Medications on File Prior to Visit  Medication Sig Dispense Refill   amLODipine (NORVASC) 10 MG tablet TAKE TABLET BY MOUTH ONCE DAILY 90 tablet 3   enalapril (VASOTEC) 10 MG tablet Take 10 mg by mouth daily.     ibuprofen (ADVIL) 200 MG tablet Take 200-400 mg by mouth every 6 (six) hours as needed for moderate pain.     naproxen (NAPROSYN) 500 MG tablet Take 1 tablet (500 mg total) by mouth 2 (two) times daily with a meal. 60 tablet 5   Omega-3 Fatty Acids (FISH OIL PO) Take 1 capsule by mouth daily.     Testosterone (FORTESTA) 10 MG/ACT (2%) GEL Place 4 Pump onto the skin daily. 60 g 3   testosterone cypionate (DEPOTESTOTERONE CYPIONATE) 100 MG/ML injection Inject 2 mLs (200 mg total) into the muscle every 14 (fourteen) days. For IM use only 10 mL 1   No current facility-administered medications on file prior to visit.   No Known Allergies  Social History   Socioeconomic History   Marital status: Married    Spouse name: Not on file   Number of children:  Not on file   Years of education: Not on file   Highest education level: 12th grade  Occupational History   Not on file  Tobacco Use   Smoking status: Never   Smokeless tobacco: Never  Vaping Use   Vaping Use: Never used  Substance and Sexual Activity   Alcohol use: Yes    Comment: occasionally   Drug use: No   Sexual activity: Yes  Other Topics Concern   Not on file  Social History Narrative   Not on file   Social Determinants of Health   Financial Resource Strain: Low Risk  (11/04/2022)   Overall Financial Resource Strain (CARDIA)    Difficulty of Paying Living Expenses: Not hard at all  Food Insecurity: No Food Insecurity (11/04/2022)   Hunger Vital Sign    Worried About Running Out of Food in the Last Year: Never true    Ran Out of Food in the Last Year: Never true  Transportation Needs: No  Transportation Needs (11/04/2022)   PRAPARE - Hydrologist (Medical): No    Lack of Transportation (Non-Medical): No  Physical Activity: Sufficiently Active (11/04/2022)   Exercise Vital Sign    Days of Exercise per Week: 7 days    Minutes of Exercise per Session: 30 min  Stress: Stress Concern Present (11/04/2022)   [place]    Feeling of Stress : To some extent  Social Connections: Moderately Isolated (11/04/2022)   Social Connection and Isolation Panel [NHANES]    Frequency of Communication with Friends and Family: More than three times a week    Frequency of Social Gatherings with Friends and Family: Once a week    Attends Religious Services: Never    Marine scientist or Organizations: No    Attends Music therapist: Not on file    Marital Status: Married  Human resources officer Violence: Not on file      Review of Systems  Skin:  Positive for rash.  All other systems reviewed and are negative.      Objective:   Physical Exam Vitals reviewed.  Constitutional:      Appearance: Normal appearance. He is normal weight.  HENT:     Right Ear: Tympanic membrane and ear canal normal.     Left Ear: Tympanic membrane and ear canal normal.     Nose: Nose normal. No congestion or rhinorrhea.     Mouth/Throat:     Mouth: Mucous membranes are moist.     Pharynx: Oropharynx is clear. No oropharyngeal exudate or posterior oropharyngeal erythema.  Eyes:     Conjunctiva/sclera: Conjunctivae normal.     Pupils: Pupils are equal, round, and reactive to light.  Cardiovascular:     Rate and Rhythm: Normal rate and regular rhythm.     Heart sounds: Normal heart sounds. No murmur heard.    No friction rub. No gallop.  Pulmonary:     Effort: Pulmonary effort is normal. No respiratory distress.     Breath sounds: Normal breath sounds. No wheezing, rhonchi or rales.  Abdominal:      General: Abdomen is flat. Bowel sounds are normal. There is no distension.     Palpations: Abdomen is soft.     Tenderness: There is no abdominal tenderness. There is no right CVA tenderness, left CVA tenderness, guarding or rebound.  Musculoskeletal:     Cervical back: Normal range of motion.  Lymphadenopathy:  Cervical: No cervical adenopathy.  Skin:    Coloration: Skin is not jaundiced.     Findings: Erythema and rash present. Rash is macular.       Neurological:     Mental Status: He is alert.           Assessment & Plan:  Dermatitis Rash is vague.  I am not certain of the cause.  I suspect an underlying dermatitis such as atopic dermatitis.  Begin triamcinolone cream twice daily for 7 days.  If becomes red hot and painful I will treat the patient as cellulitis with Keflex

## 2022-11-16 ENCOUNTER — Encounter: Payer: Self-pay | Admitting: Orthopaedic Surgery

## 2022-11-16 ENCOUNTER — Ambulatory Visit: Payer: 59 | Admitting: Orthopaedic Surgery

## 2022-11-16 VITALS — BP 140/85 | HR 78 | Ht 71.0 in | Wt 230.0 lb

## 2022-11-16 DIAGNOSIS — M7702 Medial epicondylitis, left elbow: Secondary | ICD-10-CM

## 2022-11-16 NOTE — Patient Instructions (Signed)
Use Aspercreme, Biofreeze or Voltaren gel over the counter 2-3 times daily make sure you rub it in well each time you use it.  Use ice 20 minutes 2-3 times daily and try to alter your position and try not to prop your arm when you drive  Golfer's Elbow Rehab Ask your health care provider which exercises are safe for you. Do exercises exactly as told by your health care provider and adjust them as directed. It is normal to feel mild stretching, pulling, tightness, or discomfort as you do these exercises. Stop right away if you feel sudden pain or your pain gets worse. Do not begin these exercises until told by your health care provider. Stretching and range-of-motion exercises These exercises warm up your muscles and joints and improve the movement and flexibility of your elbow. Wrist extension, assisted  Straighten your left / right elbow in front of you with your palm facing up toward the ceiling. If told by your health care provider, bend your left / right elbow to a 90-degree angle (right angle) at your side instead of holding it straight. With your other hand, gently pull your left / right hand and fingers toward the floor (extension). Stop when you feel a gentle stretch on the palm side of your forearm. Hold this position for ___10_______ seconds. Repeat ______10____ times. Complete this exercise ___1 or 2_______ times a day. Wrist flexion, assisted  Straighten your left / right elbow in front of you with your palm facing down toward the floor. If told by your health care provider, bend your left / right elbow to a 90-degree angle (right angle) at your side instead of holding it straight. With your other hand, gently push over the back of your left / right hand so your fingers point toward the floor (flexion). Stop when you feel a gentle stretch on the back of your forearm. Hold this position for ___10_______ seconds. Repeat _____10_____ times. Complete this exercise ___1 or 2_______ times a  day. Assisted forearm rotation, supination Sit or stand with your elbows at your side. Bend your left / right elbow to a 90-degree angle (right angle). Using your uninjured hand, turn your left / right palm up toward the ceiling (supination) until you feel a gentle stretch along the inside of your forearm. Hold this position for ___10_______ seconds. Repeat _____10_____ times. Complete this exercise _1 or 2_________ times a day. Assisted forearm rotation, pronation Sit or stand with your elbows at your side. Bend your left / right elbow to a 90-degree angle (right angle). Using your uninjured hand, turn your left / right palm down toward the floor (pronation) until you feel a gentle stretch along the top of your forearm. Hold this position for __10________ seconds. Repeat __10________ times. Complete this exercise _1-2_________ times a day. Strengthening exercises These exercises build strength and endurance in your elbow. Endurance is the ability to use your muscles for a long time, even after they get tired. Wrist flexion  Sit with your left / right forearm supported on a table or other surface and your palm turned up toward the ceiling. Let your left / right wrist extend over the edge of the surface. Hold a ___1 lb_______ weight or a piece of rubber exercise band or tubing. If using a rubber exercise band or tubing, hold the other end of the tubing with your other hand. Slowly bend your wrist so your hand moves up toward the ceiling (flexion). Try to only move your wrist and keep  the rest of your arm still. Hold this position for _10_________ seconds. Slowly return to the starting position. Repeat ________10__ times. Complete this exercise __1-2_______ times a day. Wrist flexion, eccentric Sit with your left / right forearm palm-up and supported on a table or other surface. Let your left / right wrist extend over the edge of the surface. Hold a __1________ weight or a piece of rubber  exercise band or tubing in your left / right hand. If using a rubber exercise band or tubing, hold the other end of the tubing with your other hand. Use your uninjured hand to move your left / right hand up toward the ceiling. Take your uninjured hand away and slowly return to the starting position using only your left / right hand (eccentric flexion). Repeat __________ times. Complete this exercise __________ times a day. Forearm rotation, pronation To do this exercise, you will need a lightweight hammer or rubber mallet. Sit with your left / right forearm supported on a table or other surface. Bend your elbow to a 90-degree angle (right angle). Position your forearm so that your palm is facing up toward the ceiling, with your hand resting over the edge of the table. Hold a hammer in your left / right hand. To make this exercise easier, hold the hammer near the head of the  This information is not intended to replace advice given to you by your health care provider. Make sure you discuss any questions you have with your health care provider. Document Revised: 10/17/2019 Document Reviewed: 10/17/2019 Elsevier Patient Education  2023 [employer] Inc.  Golfer's Elbow  Golfer's elbow (medial epicondylitis) is a condition that results from inflammation of the strong bands of tissue (tendons) that attach your forearm muscles to the inside of your bone at the elbow. These tendons affect the muscles that bend the palm toward the wrist (flexion). The tendons become less flexible with age. This condition is called golfer's elbow because it is more common among people who constantly bend and twist their wrists, such as golfers. This injury is usually caused by repeated use of the same muscles. What are the causes? This condition is caused by: Repeatedly flexing, turning, or twisting your wrist. Frequently gripping objects with your hands. Sudden injury. What increases the risk? This condition is more  likely to develop in people who play golf, baseball, or tennis. This injury is more common among people who have jobs that require the constant use of their hands, such as: People who use computers. Carpenters. Butchers. Musicians. What are the signs or symptoms? This condition causes elbow pain that may spread to your forearm and upper arm. Symptoms of this condition include: Pain at the inner elbow, forearm, or wrist. A weak grip in the hand. The pain may get worse when you bend your wrist downward. How is this diagnosed? This condition is diagnosed based on your symptoms, your medical history, and a physical exam. During the exam, your health care provider may: Test your grip strength. Move your wrist to check for pain. You may also have an MRI to: Confirm the diagnosis. Look for other issues. Check for tears in the ligaments, muscles, or tendons. How is this treated? Treatment for this condition includes: Stopping all activities that make you bend or twist your elbow or wrist and waiting until your pain and other symptoms go away before resuming those activities. Wearing an elbow brace or wrist splint to restrict the movements that cause symptoms. Icing your inner elbow, forearm,  or wrist to relieve pain. Taking NSAIDs, such as ibuprofen, or getting corticosteroid injections to reduce pain and swelling. Doing stretching, range-of-motion, and strengthening exercises (physical therapy) as told by your health care provider. In rare cases, surgery may be needed if your condition does not improve. Follow these instructions at home: If you have a brace or splint: Wear the brace or splint as told by your health care provider. Remove it only as told by your health care provider. Check the skin around the brace or splint every day. Tell your health care provider about any concerns. Loosen the brace or splint if your fingers tingle, become numb, or turn cold and blue. Keep it clean. If the  brace or splint is not waterproof: Do not let it get wet. Cover it with a watertight covering when you take a bath or shower. Managing pain, stiffness, and swelling  If directed, put ice on the injured area. To do this: If you have a removable brace or splint, remove it as told by your health care provider. Put ice in a plastic bag. Place a towel between your skin and the bag. Leave the ice on for 20 minutes, 2-3 times a day. Remove the ice if your skin turns bright red. This is very important. If you cannot feel pain, heat, or cold, you have a greater risk of damage to the area. Move your fingers often to avoid stiffness and swelling. Activity Rest your injured area as told by your health care provider. Return to your normal activities as told by your health care provider. Ask your health care provider what activities are safe for you. Do exercises as told by your health care provider. Lifestyle If your condition is caused by sports, work with a trainer to make sure that you: Use the correct technique. Use the proper equipment. If your condition is work related, talk with your employer about ways to manage your condition at work. General instructions Take over-the-counter and prescription medicines only as told by your health care provider. Do not use any products that contain nicotine or tobacco. These products include cigarettes, chewing tobacco, and vaping devices, such as e-cigarettes. If you need help quitting, ask your health care provider. Keep all follow-up visits. This is important. How is this prevented? Before and after activity: Warm up and stretch before being active. Cool down and stretch after being active. Give your body time to rest between periods of activity. During activity: Make sure to use equipment that fits you. If you play golf, slow your golf swing to reduce shock in the arm when making contact with the ball. Maintain physical fitness,  including: Strength. Flexibility. Endurance. Do exercises to strengthen the forearm muscles. Contact a health care provider if: Your pain does not improve or it gets worse. You notice numbness in your hand. Get help right away if: Your pain is severe. You cannot move your wrist. Summary Golfer's elbow, also called medial epicondylitis, is a condition that results from inflammation of the strong bands of tissue (tendons) that attach your forearm muscles to the inside of your bone at the elbow. This injury usually results from overuse. Symptoms of this condition include decreased grip strength and pain at the inner elbow, forearm, or wrist. This injury is treated with rest, a brace or splint, ice, medicines, physical therapy, and surgery as needed. This information is not intended to replace advice given to you by your health care provider. Make sure you discuss any questions you have with  your health care provider. Document Revised: 02/05/2020 Document Reviewed: 02/05/2020 Elsevier Patient Education  2023 [employer].

## 2022-11-16 NOTE — Progress Notes (Signed)
My left elbow hurts.  He has pain medial left elbow over the epicondyle area.  He denies any trauma.  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  He extends his left arm and uses the left arm mainly.  He denies any trauma, no swelling, no redness.  He has had this about a month or so.  He is tender over the medial epicondyle more anteriorly and toward the mid arm.  He has no numbness and no ulnar nerve problems.  NV intact.  Grips are good.  Encounter Diagnosis  Name Primary?   Medial epicondylitis, left elbow Yes   I have explained to him he may need to modify how he drives.  I have recommended ice massage as well and use of Biofreeze, Aspercreme or Voltaren gel or other such rubs.  He does not like to take any medicine.  Return in one month.  Call if any problem.  Precautions discussed.  Electronically Signed Darreld Mclean, MD 4/9/20243:20 PM

## 2022-12-08 ENCOUNTER — Encounter: Payer: Self-pay | Admitting: Family Medicine

## 2022-12-14 ENCOUNTER — Encounter: Payer: Self-pay | Admitting: Orthopaedic Surgery

## 2022-12-14 ENCOUNTER — Ambulatory Visit: Payer: 59 | Admitting: Orthopaedic Surgery

## 2022-12-14 VITALS — BP 130/82 | HR 86 | Ht 71.0 in | Wt 230.0 lb

## 2022-12-14 DIAGNOSIS — M25522 Pain in left elbow: Secondary | ICD-10-CM | POA: Diagnosis not present

## 2022-12-14 NOTE — Progress Notes (Signed)
My elbow is sore sometimes and not others.  He has intermittent pain of the left elbow area, it is more dorsally and more near the biceps insertion now.  He has no redness, no loss of motion, no numbness.  ROM of the left elbow is full and exam is negative today.  He has no tenderness today.  He had prior repair of the right biceps tendon at the elbow and he is concerned about this one.  He has no pain in that area today.  NV intact.  Encounter Diagnosis  Name Primary?   Left elbow pain Yes   I told him to resume his Naprosyn.  Call if any change.  Return in three weeks.  Electronically Signed Darreld Mclean, MD 5/7/20243:08 PM

## 2023-01-04 ENCOUNTER — Ambulatory Visit: Payer: 59 | Admitting: Orthopaedic Surgery

## 2023-01-04 ENCOUNTER — Encounter: Payer: Self-pay | Admitting: Orthopaedic Surgery

## 2023-01-04 VITALS — BP 155/99 | HR 88 | Ht 71.0 in | Wt 230.0 lb

## 2023-01-04 DIAGNOSIS — S46219A Strain of muscle, fascia and tendon of other parts of biceps, unspecified arm, initial encounter: Secondary | ICD-10-CM | POA: Diagnosis not present

## 2023-01-04 DIAGNOSIS — M25522 Pain in left elbow: Secondary | ICD-10-CM

## 2023-01-04 NOTE — Patient Instructions (Signed)
While we are working on your approval for MRI please go ahead and call to schedule your appointment with [facility] within at least one (1) week.   Central Scheduling [phone]  

## 2023-01-04 NOTE — Progress Notes (Signed)
My elbow is still sore.  He has more pain now around the biceps tendon at the elbow.  Some times he has medial elbow pain and other times lateral elbow pain but the biceps central area is the area of most of his pain now.  He had biceps tendon rupture at the elbow on the right several years ago and had surgical repair.  I am concerned he may be having fraying or problems with the biceps tendon at the elbow on the left now.  I will get MRI.  He has full ROM of the left elbow but some tenderness of the biceps tendon, no redness, no swelling, NV Intact.  Encounter Diagnoses  Name Primary?   Left elbow pain Yes   Biceps tendon tear / history of r\ight biceps tendon tear./ with repair    Get MRI of the left elbow.  Return in two weeks.  Call if any problem.  Precautions discussed.  Electronically Signed Darreld Mclean, MD 5/28/20242:03 PM

## 2023-01-10 ENCOUNTER — Ambulatory Visit (HOSPITAL_COMMUNITY)
Admission: RE | Admit: 2023-01-10 | Discharge: 2023-01-10 | Disposition: A | Discharge status: Home / Self Care | Payer: 59 | Source: Ambulatory Visit | Attending: Orthopaedic Surgery | Admitting: Orthopaedic Surgery

## 2023-01-10 DIAGNOSIS — M25522 Pain in left elbow: Secondary | ICD-10-CM | POA: Diagnosis present

## 2023-01-18 ENCOUNTER — Encounter: Payer: Self-pay | Admitting: Orthopaedic Surgery

## 2023-01-18 ENCOUNTER — Ambulatory Visit: Payer: 59 | Admitting: Orthopaedic Surgery

## 2023-01-18 VITALS — BP 137/89 | HR 64 | Ht 70.0 in | Wt 234.0 lb

## 2023-01-18 DIAGNOSIS — M25522 Pain in left elbow: Secondary | ICD-10-CM

## 2023-01-18 DIAGNOSIS — S46212A Strain of muscle, fascia and tendon of other parts of biceps, left arm, initial encounter: Secondary | ICD-10-CM | POA: Diagnosis not present

## 2023-01-18 DIAGNOSIS — S46219A Strain of muscle, fascia and tendon of other parts of biceps, unspecified arm, initial encounter: Secondary | ICD-10-CM

## 2023-01-18 NOTE — Progress Notes (Signed)
I read the report.  He had MRI of the left elbow and it showed: IMPRESSION: 1. Moderate tendinosis of the distal biceps tendon with a high-grade partial-thickness tear just proximal to the radial insertion. 2. Mild tendinosis of the common flexor tendon origin.  I have explained the findings to him.  I will have Dr. Amanda Pea see him as he did surgery on the patient's right elbow for similar problem.  He has tenderness at the biceps area of the elbow but good function.  NV intact.  ROM elbow is full.  I have independently reviewed the MRI.      Encounter Diagnoses  Name Primary?   Left elbow pain Yes   Biceps tendon tear / history of r\ight biceps tendon tear./ with repair    To see Dr. Amanda Pea.  Call if any problem.  Precautions discussed.  Electronically Signed Darreld Mclean, MD 6/11/20248:08 AM

## 2023-01-18 NOTE — Progress Notes (Signed)
Referral to Dr Junie Bame @ [facility]

## 2023-01-24 ENCOUNTER — Telehealth: Payer: Self-pay | Admitting: Orthopaedic Surgery

## 2023-01-24 NOTE — Telephone Encounter (Signed)
Christopher Lawson   Patient called and wants to know why no one has called him back about his referral to see Dr. Amanda Pea.  Please call the patient back at [phone]

## 2023-01-24 NOTE — Telephone Encounter (Signed)
Referral has been put in now. It was missed when he was in the office. He will get a call , it may speed things up for him to call to schedule [phone] is the number referral will be sent soon, he should call after the referral is sent.

## 2023-01-24 NOTE — Addendum Note (Signed)
Addended byCaffie Damme on: 01/24/2023 03:41 PM   Modules accepted: Orders

## 2023-01-28 ENCOUNTER — Ambulatory Visit (HOSPITAL_COMMUNITY): Payer: 59

## 2023-01-31 ENCOUNTER — Other Ambulatory Visit: Payer: Self-pay | Admitting: Family Medicine

## 2023-02-14 ENCOUNTER — Ambulatory Visit: Payer: 59 | Admitting: Family Medicine

## 2023-02-14 ENCOUNTER — Encounter: Payer: Self-pay | Admitting: Family Medicine

## 2023-02-14 VITALS — BP 126/72 | HR 79 | Temp 98.7°F | Ht 70.0 in | Wt 234.0 lb

## 2023-02-14 DIAGNOSIS — E291 Testicular hypofunction: Secondary | ICD-10-CM | POA: Diagnosis not present

## 2023-02-14 LAB — CBC WITH DIFFERENTIAL/PLATELET
Basophils Absolute: 30 cells/uL (ref 0–200)
Basophils Relative: 0.4 %
HCT: 47.7 % (ref 38.5–50.0)
MCHC: 33.8 g/dL (ref 32.0–36.0)
MPV: 10.5 fL (ref 7.5–12.5)
RDW: 12.7 % (ref 11.0–15.0)
WBC: 7.5 10*3/uL (ref 3.8–10.8)

## 2023-02-14 MED ORDER — MELOXICAM 15 MG PO TABS: 15.0000 mg | ORAL_TABLET | Freq: Every day | ORAL | 0 refills | Status: DC

## 2023-02-14 NOTE — Progress Notes (Signed)
Subjective:    Patient ID: Christopher Lawson, male    DOB: [birth year], 52 y.o.   MRN: [id number]  Neck Pain   Hand Pain  First, the patient is on testosterone 200 mg every 14 days for hypogonadism.  He is due for refill.  Therefore he is here today for lab work to monitor for any polycythemia or elevation in his PSA.  He states that the fatigue has improved since starting testosterone.  Second he reports of pain in the center of his back roughly around the level of T4.  It started 3 weeks ago while riding his motorcycle.  Resisted neck extension exacerbates the pain.  There is no pain with range of motion actively or passively in either shoulder.  There is no tenderness to palpation in any of the thoracic spine.  The patient denies any numbness or tingling radiating down his arms.  He has a negative Spurling sign.  He has normal muscle strength 5/5 equal and symmetric in both arms and normal reflexes.  I suspect that this is a strained muscle specifically the trapezius.  He also reports some numbness and tingling on the radial aspect of the second MCP joint.  He felt that joint pop about 2 weeks ago while doing an activity.  He has full range of motion in the MCP joint, the PIP joint, and the DIP joint.  There is no evidence of any tear in the extensor or flexor tendon.  The finger is neurovascularly intact however he does have some residual tenderness and numbness and tingling radiating down the lateral aspect of the finger.  Past Medical History:  Diagnosis Date  . Abrasion of right index finger 11/20/2012  . Arthritis    left shoulder - to have surgery 12/12/2012  . Cellulitis of left lower limb   . Dental crown present   . Deviated nasal septum 11/2012  . Essential (primary) hypertension   . 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 Fatty (change of) liver, not elsewhere classified   . Fatty liver   . History of kidney stones   . Hypertension    under control with meds.,  has been on med. x 5 yr.  . 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  . Obesity, unspecified   . Pain in left knee   . Pain in right foot   . Pain in right shoulder   . Sebaceous cyst   . Spontaneous rupture of other tendons, right upper arm   . Tinnitus, right ear   . Unilateral primary osteoarthritis, left knee    Past Surgical History:  Procedure Laterality Date  . bicep tendon surgery Right   . CYST EXCISION     wrist and back (same surgery)  . EXTRACORPOREAL SHOCK WAVE LITHOTRIPSY Left 05/11/2022   Procedure: EXTRACORPOREAL SHOCK WAVE LITHOTRIPSY (ESWL);  Surgeon: Malen Gauze, MD;  Location: [facility];  Service: Urology;  Laterality: Left;  . INGUINAL HERNIA REPAIR Right   . left shoulder surgery    . NASAL SEPTOPLASTY W/ TURBINOPLASTY Bilateral 11/27/2012   Procedure: NASAL SEPTOPLASTY WITH BILATERAL TURBINATE RESECTION;  Surgeon: Darletta Moll, MD;  Location: [facility];  Service: ENT;  Laterality: Bilateral;  . UMBILICAL HERNIA REPAIR N/A 01/29/2022   Procedure: HERNIA REPAIR UMBILICAL ADULT WITH MESH;  Surgeon: Lucretia Roers, MD;  Location: [facility];  Service: General;  Laterality: N/A;  . WISDOM TOOTH EXTRACTION  Current Outpatient Medications on File Prior to Visit  Medication Sig Dispense Refill  . amLODipine (NORVASC) 10 MG tablet TAKE 1 TABLET BY MOUTH ONCE  DAILY 90 tablet 1  . cephALEXin (KEFLEX) 500 MG capsule Take 500 mg by mouth 4 (four) times daily. (Patient not taking: Reported on 01/18/2023)    . enalapril (VASOTEC) 10 MG tablet Take 10 mg by mouth daily.    Marland Kitchen ibuprofen (ADVIL) 200 MG tablet Take 200-400 mg by mouth every 6 (six) hours as needed for moderate pain.    . naproxen (NAPROSYN) 500 MG tablet Take 1 tablet (500 mg total) by mouth 2 (two) times daily with a meal. 60 tablet 5  . Omega-3 Fatty Acids (FISH OIL PO) Take 1 capsule by mouth daily.    . Testosterone (FORTESTA) 10 MG/ACT (2%) GEL Place 4 Pump onto the skin daily. 60  g 3  . testosterone cypionate (DEPOTESTOTERONE CYPIONATE) 100 MG/ML injection Inject 2 mLs (200 mg total) into the muscle every 14 (fourteen) days. For IM use only 10 mL 1  . triamcinolone cream (KENALOG) 0.1 % Apply 1 Application topically 2 (two) times daily. 30 g 0   No current facility-administered medications on file prior to visit.   No Known Allergies  Social History   Socioeconomic History  . Marital status: Married    Spouse name: Not on file  . Number of children: Not on file  . Years of education: Not on file  . Highest education level: 12th grade  Occupational History  . Not on file  Tobacco Use  . Smoking status: Never  . Smokeless tobacco: Never  Vaping Use  . Vaping Use: Never used  Substance and Sexual Activity  . Alcohol use: Yes    Comment: occasionally  . Drug use: No  . Sexual activity: Yes  Other Topics Concern  . Not on file  Social History Narrative  . Not on file   Social Determinants of Health   Financial Resource Strain: Low Risk  (11/04/2022)   Overall Financial Resource Strain (CARDIA)   . Difficulty of Paying Living Expenses: Not hard at all  Food Insecurity: No Food Insecurity (11/04/2022)   Hunger Vital Sign   . Worried About Programme researcher, broadcasting/film/video in the Last Year: Never true   . Ran Out of Food in the Last Year: Never true  Transportation Needs: No Transportation Needs (11/04/2022)   PRAPARE - Transportation   . Lack of Transportation (Medical): No   . Lack of Transportation (Non-Medical): No  Physical Activity: Sufficiently Active (11/04/2022)   Exercise Vital Sign   . Days of Exercise per Week: 7 days   . Minutes of Exercise per Session: 30 min  Stress: Stress Concern Present (11/04/2022)   [employer] of Occupational Health - Occupational Stress Questionnaire   . Feeling of Stress : To some extent  Social Connections: Moderately Isolated (11/04/2022)   Social Connection and Isolation Panel [NHANES]   . Frequency of Communication  with Friends and Family: More than three times a week   . Frequency of Social Gatherings with Friends and Family: Once a week   . Attends Religious Services: Never   . Active Member of Clubs or Organizations: No   . Attends Banker Meetings: Not on file   . Marital Status: Married  Catering manager Violence: Not on file      Review of Systems  Musculoskeletal:  Positive for neck pain.  All other systems reviewed and are  negative.      Objective:   Physical Exam Vitals reviewed.  Constitutional:      Appearance: Normal appearance. He is normal weight.  HENT:     Nose: Nose normal. No congestion or rhinorrhea.     Mouth/Throat:     Mouth: Mucous membranes are moist.     Pharynx: Oropharynx is clear. No oropharyngeal exudate or posterior oropharyngeal erythema.  Eyes:     Conjunctiva/sclera: Conjunctivae normal.     Pupils: Pupils are equal, round, and reactive to light.  Cardiovascular:     Rate and Rhythm: Normal rate and regular rhythm.     Heart sounds: Normal heart sounds. No murmur heard.    No friction rub. No gallop.  Pulmonary:     Effort: Pulmonary effort is normal. No respiratory distress.     Breath sounds: Normal breath sounds. No wheezing, rhonchi or rales.  Abdominal:     General: Abdomen is flat. Bowel sounds are normal. There is no distension.     Palpations: Abdomen is soft.     Tenderness: There is no abdominal tenderness. There is no right CVA tenderness, left CVA tenderness, guarding or rebound.  Musculoskeletal:     Right hand: No tenderness or bony tenderness. Normal range of motion. Normal strength.     Left hand: No tenderness or bony tenderness. Normal range of motion. Normal strength.       Hands:     Cervical back: Normal range of motion. Pain with movement present. Normal range of motion.     Thoracic back: Tenderness present. No spasms. Normal range of motion.       Back:  Lymphadenopathy:     Cervical: No cervical adenopathy.   Skin:    Coloration: Skin is not jaundiced.     Findings: No erythema or rash.  Neurological:     Mental Status: He is alert.          Assessment & Plan:  Hypogonadism in male - Plan: CBC with Differential/Platelet, COMPLETE METABOLIC PANEL WITH GFR, PSA, Testosterone Total,Free,Bio, Males I will check a PSA to ensure there is not been growth in his PSA since starting testosterone.  I will check a CBC to ensure that there is no polycythemia.  Symptomatically the patient is improving so assuming no adverse effects on his lab work we will continue testosterone replacement 200 mg IM every 2 weeks.  I suspect that the patient likely strained his trapezius muscle.  Begin meloxicam 15 mg daily for 2 to 3 weeks.  Consider imaging if not improving.  I suspect the patient likely sprained his right second MCP joint and the residual swelling is causing some numbness and paresthesias due to pressure against the digital nerve.  Try meloxicam as an anti-inflammatory and see if symptoms gradually improve.  Also on the differential diagnosis would be carpal tunnel syndrome however I feel that this is more likely to the popping sensation he experienced in the MCP joint that seem to trigger the paresthesia

## 2023-02-15 ENCOUNTER — Other Ambulatory Visit: Payer: Self-pay | Admitting: Family Medicine

## 2023-02-15 LAB — COMPLETE METABOLIC PANEL WITH GFR
AG Ratio: 1.9 (calc) (ref 1.0–2.5)
ALT: 43 U/L (ref 9–46)
AST: 22 U/L (ref 10–35)
Albumin: 4.7 g/dL (ref 3.6–5.1)
Alkaline phosphatase (APISO): 100 U/L (ref 35–144)
BUN: 17 mg/dL (ref 7–25)
CO2: 24 mmol/L (ref 20–32)
Calcium: 9.5 mg/dL (ref 8.6–10.3)
Chloride: 107 mmol/L (ref 98–110)
Creat: 0.99 mg/dL (ref 0.70–1.30)
Globulin: 2.5 g/dL (calc) (ref 1.9–3.7)
Glucose, Bld: 95 mg/dL (ref 65–99)
Potassium: 4.1 mmol/L (ref 3.5–5.3)
Sodium: 140 mmol/L (ref 135–146)
Total Bilirubin: 0.5 mg/dL (ref 0.2–1.2)
Total Protein: 7.2 g/dL (ref 6.1–8.1)
eGFR: 92 mL/min/{1.73_m2} (ref >=60)

## 2023-02-15 LAB — CBC WITH DIFFERENTIAL/PLATELET
Absolute Monocytes: 540 cells/uL (ref 200–950)
Eosinophils Absolute: 405 cells/uL (ref 15–500)
Eosinophils Relative: 5.4 %
Hemoglobin: 16.1 g/dL (ref 13.2–17.1)
Lymphs Abs: 1830 cells/uL (ref 850–3900)
MCH: 30.7 pg (ref 27.0–33.0)
MCV: 90.9 fL (ref 80.0–100.0)
Monocytes Relative: 7.2 %
Neutro Abs: 4695 cells/uL (ref 1500–7800)
Neutrophils Relative %: 62.6 %
Platelets: 216 10*3/uL (ref 140–400)
RBC: 5.25 10*6/uL (ref 4.20–5.80)
Total Lymphocyte: 24.4 %

## 2023-02-15 LAB — TESTOSTERONE TOTAL,FREE,BIO, MALES
Albumin: 4.7 g/dL (ref 3.6–5.1)
Sex Hormone Binding: 32 nmol/L (ref 10–50)
Testosterone, Bioavailable: 77.2 ng/dL — ABNORMAL LOW (ref 110.0–575.0)
Testosterone, Free: 36 pg/mL — ABNORMAL LOW (ref 46.0–224.0)
Testosterone: 282 ng/dL (ref 250–827)

## 2023-02-15 LAB — PSA: PSA: 1.1 ng/mL (ref <=4.00)

## 2023-02-15 MED ORDER — TESTOSTERONE CYPIONATE 100 MG/ML IM SOLN: 200.0000 mg | INTRAMUSCULAR | 1 refills | Status: DC

## 2023-02-16 ENCOUNTER — Encounter: Payer: Self-pay | Admitting: Family Medicine

## 2023-02-18 ENCOUNTER — Other Ambulatory Visit: Payer: Self-pay

## 2023-02-18 DIAGNOSIS — L309 Dermatitis, unspecified: Secondary | ICD-10-CM

## 2023-02-18 MED ORDER — TRIAMCINOLONE ACETONIDE 0.1 % EX CREA
1.0000 | TOPICAL_CREAM | Freq: Two times a day (BID) | CUTANEOUS | 2 refills | Status: DC
Start: 2023-02-18 — End: 2024-01-17

## 2023-03-15 ENCOUNTER — Ambulatory Visit: Payer: 59 | Admitting: Family Medicine

## 2023-03-15 ENCOUNTER — Encounter: Payer: Self-pay | Admitting: Family Medicine

## 2023-03-15 VITALS — BP 132/80 | HR 86 | Ht 70.0 in | Wt 238.0 lb

## 2023-03-15 DIAGNOSIS — E291 Testicular hypofunction: Secondary | ICD-10-CM

## 2023-03-15 DIAGNOSIS — Z0001 Encounter for general adult medical examination with abnormal findings: Secondary | ICD-10-CM | POA: Diagnosis not present

## 2023-03-15 DIAGNOSIS — Z1211 Encounter for screening for malignant neoplasm of colon: Secondary | ICD-10-CM | POA: Diagnosis not present

## 2023-03-15 DIAGNOSIS — I10 Essential (primary) hypertension: Secondary | ICD-10-CM | POA: Diagnosis not present

## 2023-03-15 DIAGNOSIS — Z0000 Encounter for general adult medical examination without abnormal findings: Secondary | ICD-10-CM

## 2023-03-15 NOTE — Progress Notes (Signed)
Subjective:    Patient ID: Christopher Lawson, male    DOB: 01-10-1971, 52 y.o.   MRN: [id number]  Patient is a very pleasant 52 year old Caucasian gentleman here today for complete physical exam.  He states that his last colonoscopy was more than 10 years ago.  He also had a polyp.  Therefore I recommended that he repeat his colonoscopy now.  We discussed Cologuard however I recommended a colonoscopy given his history of polyps.  I recently checked his PSA due to his treatment with testosterone for hypergonadism.  His PSA was within normal range.  Therefore his cancer screening is up-to-date.  I reviewed his recent lab work including a CBC, CMP, PSA, and testosterone level.  Testosterone was low but the patient was at the end of his cycle.  He is doing testosterone shots every 2 weeks and he was due that day.  Otherwise his labs were fine.  However he has not had a fasting lipid panel.  He is due for the shingles vaccine.  Patient politely declines this.  His tetanus shot is up-to-date.  Past Medical History:  Diagnosis Date   Abrasion of right index finger 11/20/2012   Arthritis    left shoulder - to have surgery 12/12/2012   Cellulitis of left lower limb    Dental crown present    Deviated nasal septum 11/2012   Essential (primary) hypertension    Factor V deficiency (HCC)    states never had a problem   Fatty (change of) liver, not elsewhere classified    Fatty liver    History of kidney stones    Hypertension    under control with meds., has been on med. x 5 yr.   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   Obesity, unspecified    Pain in left knee    Pain in right foot    Pain in right shoulder    Sebaceous cyst    Spontaneous rupture of other tendons, right upper arm    Tinnitus, right ear    Unilateral primary osteoarthritis, left knee    Past Surgical History:  Procedure Laterality Date   bicep tendon surgery Right    CYST EXCISION     wrist and back (same surgery)    EXTRACORPOREAL SHOCK WAVE LITHOTRIPSY Left 05/11/2022   Procedure: EXTRACORPOREAL SHOCK WAVE LITHOTRIPSY (ESWL);  Surgeon: Malen Gauze, MD;  Location: [facility];  Service: Urology;  Laterality: Left;   INGUINAL HERNIA REPAIR Right    left shoulder surgery     NASAL SEPTOPLASTY W/ TURBINOPLASTY Bilateral 11/27/2012   Procedure: NASAL SEPTOPLASTY WITH BILATERAL TURBINATE RESECTION;  Surgeon: Darletta Moll, MD;  Location: [facility];  Service: ENT;  Laterality: Bilateral;   UMBILICAL HERNIA REPAIR N/A 01/29/2022   Procedure: HERNIA REPAIR UMBILICAL ADULT WITH MESH;  Surgeon: Lucretia Roers, MD;  Location: [facility];  Service: General;  Laterality: N/A;   WISDOM TOOTH EXTRACTION     Current Outpatient Medications on File Prior to Visit  Medication Sig Dispense Refill   amLODipine (NORVASC) 10 MG tablet TAKE 1 TABLET BY MOUTH ONCE  DAILY 90 tablet 1   cephALEXin (KEFLEX) 500 MG capsule Take 500 mg by mouth 4 (four) times daily. (Patient not taking: Reported on 01/18/2023)     enalapril (VASOTEC) 10 MG tablet Take 10 mg by mouth daily.     ibuprofen (ADVIL) 200 MG tablet Take 200-400 mg by mouth every 6 (six) hours as needed for  moderate pain.     meloxicam (MOBIC) 15 MG tablet Take 1 tablet (15 mg total) by mouth daily. 30 tablet 0   naproxen (NAPROSYN) 500 MG tablet Take 1 tablet (500 mg total) by mouth 2 (two) times daily with a meal. 60 tablet 5   Omega-3 Fatty Acids (FISH OIL PO) Take 1 capsule by mouth daily.     Testosterone (FORTESTA) 10 MG/ACT (2%) GEL Place 4 Pump onto the skin daily. 60 g 3   testosterone cypionate (DEPOTESTOTERONE CYPIONATE) 100 MG/ML injection Inject 2 mLs (200 mg total) into the muscle every 14 (fourteen) days. For IM use only 10 mL 1   triamcinolone cream (KENALOG) 0.1 % Apply 1 Application topically 2 (two) times daily. 30 g 2   No current facility-administered medications on file prior to visit.   No Known Allergies  Social History    Socioeconomic History   Marital status: Married    Spouse name: Not on file   Number of children: Not on file   Years of education: Not on file   Highest education level: 12th grade  Occupational History   Not on file  Tobacco Use   Smoking status: Never   Smokeless tobacco: Never  Vaping Use   Vaping status: Never Used  Substance and Sexual Activity   Alcohol use: Yes    Comment: occasionally   Drug use: No   Sexual activity: Yes  Other Topics Concern   Not on file  Social History Narrative   Not on file   Social Determinants of Health   Financial Resource Strain: Low Risk  (11/04/2022)   Overall Financial Resource Strain (CARDIA)    Difficulty of Paying Living Expenses: Not hard at all  Food Insecurity: No Food Insecurity (11/04/2022)   Hunger Vital Sign    Worried About Running Out of Food in the Last Year: Never true    Ran Out of Food in the Last Year: Never true  Transportation Needs: No Transportation Needs (11/04/2022)   PRAPARE - Administrator, Civil Service (Medical): No    Lack of Transportation (Non-Medical): No  Physical Activity: Sufficiently Active (11/04/2022)   Exercise Vital Sign    Days of Exercise per Week: 7 days    Minutes of Exercise per Session: 30 min  Stress: Stress Concern Present (11/04/2022)   [employer] of Occupational Health - Occupational Stress Questionnaire    Feeling of Stress : To some extent  Social Connections: Moderately Isolated (11/04/2022)   Social Connection and Isolation Panel [NHANES]    Frequency of Communication with Friends and Family: More than three times a week    Frequency of Social Gatherings with Friends and Family: Once a week    Attends Religious Services: Never    Database administrator or Organizations: No    Attends Engineer, structural: Not on file    Marital Status: Married  Catering manager Violence: Not on file      Review of Systems  Musculoskeletal:  Positive for neck  pain.  All other systems reviewed and are negative.      Objective:   Physical Exam Vitals reviewed.  Constitutional:      General: He is not in acute distress.    Appearance: Normal appearance. He is normal weight. He is not ill-appearing, toxic-appearing or diaphoretic.  HENT:     Head: Normocephalic and atraumatic.     Right Ear: Tympanic membrane and ear canal normal.  Left Ear: Tympanic membrane and ear canal normal.     Nose: Nose normal. No congestion or rhinorrhea.     Mouth/Throat:     Mouth: Mucous membranes are moist.     Pharynx: Oropharynx is clear. No oropharyngeal exudate or posterior oropharyngeal erythema.  Eyes:     Extraocular Movements: Extraocular movements intact.     Conjunctiva/sclera: Conjunctivae normal.     Pupils: Pupils are equal, round, and reactive to light.  Neck:     Vascular: No carotid bruit.  Cardiovascular:     Rate and Rhythm: Normal rate and regular rhythm.     Heart sounds: Normal heart sounds. No murmur heard.    No friction rub. No gallop.  Pulmonary:     Effort: Pulmonary effort is normal. No respiratory distress.     Breath sounds: Normal breath sounds. No wheezing, rhonchi or rales.  Abdominal:     General: Abdomen is flat. Bowel sounds are normal. There is no distension.     Palpations: Abdomen is soft.     Tenderness: There is no abdominal tenderness. There is no right CVA tenderness, left CVA tenderness, guarding or rebound.  Musculoskeletal:     Cervical back: Normal range of motion and neck supple.     Right lower leg: No edema.     Left lower leg: No edema.  Lymphadenopathy:     Cervical: No cervical adenopathy.  Skin:    Coloration: Skin is not jaundiced.     Findings: Lesion present. No erythema or rash.  Neurological:     General: No focal deficit present.     Mental Status: He is alert and oriented to person, place, and time. Mental status is at baseline.     Cranial Nerves: No cranial nerve deficit.      Sensory: No sensory deficit.     Motor: No weakness.     Coordination: Coordination normal.     Gait: Gait normal.     Deep Tendon Reflexes: Reflexes normal.  Psychiatric:        Mood and Affect: Mood normal.        Behavior: Behavior normal.        Thought Content: Thought content normal.        Judgment: Judgment normal.    Patient has numerous scars on his back from skin surgery but has noticed that he also has a sebaceous cyst roughly at the level of T7 in the center of his back.       Assessment & Plan:  Colon cancer screening - Plan: Ambulatory referral to Gastroenterology  Hypogonadism in male  Primary hypertension - Plan: Lipid panel  General medical exam - Plan: Lipid panel Recommended the shingles vaccine but he politely declined.  I will schedule the patient for colonoscopy.  Prostate cancer screening is up-to-date.  Blood pressure today is acceptable.  I reviewed his CBC his CMP and his PSA from his recent lab work and these were good.  I recommended he return fasting for fasting lipid panel.  Goal LDL cholesterol is less than 100

## 2023-03-17 ENCOUNTER — Other Ambulatory Visit: Payer: 59

## 2023-03-21 ENCOUNTER — Encounter (INDEPENDENT_AMBULATORY_CARE_PROVIDER_SITE_OTHER): Payer: Self-pay | Admitting: *Deleted

## 2023-03-21 ENCOUNTER — Other Ambulatory Visit: Payer: Self-pay

## 2023-03-21 ENCOUNTER — Encounter: Payer: Self-pay | Admitting: Family Medicine

## 2023-03-21 MED ORDER — ROSUVASTATIN CALCIUM 10 MG PO TABS: 10.0000 mg | ORAL_TABLET | Freq: Every day | ORAL | 3 refills | Status: DC

## 2023-03-30 ENCOUNTER — Encounter: Payer: Self-pay | Admitting: Family Medicine

## 2023-03-31 ENCOUNTER — Other Ambulatory Visit: Payer: Self-pay | Admitting: Family Medicine

## 2023-03-31 MED ORDER — CEPHALEXIN 500 MG PO CAPS: 500.0000 mg | ORAL_CAPSULE | Freq: Three times a day (TID) | ORAL | 0 refills | Status: DC

## 2023-04-04 ENCOUNTER — Telehealth (INDEPENDENT_AMBULATORY_CARE_PROVIDER_SITE_OTHER): Payer: Self-pay | Admitting: *Deleted

## 2023-04-04 NOTE — Telephone Encounter (Signed)
Room 1 Thanks 

## 2023-04-04 NOTE — Telephone Encounter (Signed)
Referring MD/PCP: Tanya Nones  Insurance: [employer] [id number]   Best Phone Number: [phone]  Reason for the colonoscopy screening  Has patient had this procedure before?  Yes, when ?  If so, when, by whom and where?    Is there a family history of colon cancer?  no  Who?  What age when diagnosed?    Is patient diabetic? If yes, Type 1 or Type 2   no      Does patient have prosthetic heart valve or mechanical valve?  no  Do you have a pacemaker/defibrillator?  no  Has patient ever had endocarditis/atrial fibrillation? no  Has patient had joint replacement within last 12 months?  no  Is patient constipated or do they take laxatives? no  Does patient have a history of alcohol/drug use?  no  Does patient use oxygen? no  Have you had a stroke/heart attack last 6 mths? no  Do you take medicine for weight loss?  no  For male patients,: have you had a hysterectomy                       are you post menopausal                       do you still have your menstrual cycle   Do you take any blood-thinning medications such as: (aspirin, warfarin, Plavix, Aggrenox)  no  If yes we need the name, milligram, dosage and who is prescribing doctor   Medications: amlodipine 10 mg daily, omega 3 fish oil 900 mg/1400 mg daily, enalapril 10 mg daily, rosuvastatin 10 mg daily  Allergies: NKDA  Pharmacy: [facility]

## 2023-04-14 ENCOUNTER — Encounter (INDEPENDENT_AMBULATORY_CARE_PROVIDER_SITE_OTHER): Payer: Self-pay | Admitting: *Deleted

## 2023-04-14 MED ORDER — PEG 3350-KCL-NA BICARB-NACL 420 G PO SOLR: 4000.0000 mL | Freq: Once | ORAL | 0 refills | Status: AC

## 2023-04-14 NOTE — Telephone Encounter (Signed)
Thanks

## 2023-04-14 NOTE — Telephone Encounter (Signed)
Referral completed

## 2023-04-14 NOTE — Telephone Encounter (Signed)
Pt contacted and scheduled for 05/06/23 at 10:00am. Instructions sent via my chart. Prep sent to pharmacy.  Per [facility] This member is not in scope for prior-authorization/notification for the services requested. You can save the case reference ID as validation of your request.  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

## 2023-05-06 ENCOUNTER — Ambulatory Visit (HOSPITAL_COMMUNITY): Payer: 59 | Admitting: Certified Registered Nurse Anesthetist

## 2023-05-06 ENCOUNTER — Encounter (HOSPITAL_COMMUNITY): Payer: Self-pay | Admitting: Gastroenterology

## 2023-05-06 ENCOUNTER — Encounter (HOSPITAL_COMMUNITY)
Admission: RE | Disposition: A | Discharge status: Home / Self Care | Payer: Self-pay | Source: Home / Self Care | Attending: Gastroenterology

## 2023-05-06 ENCOUNTER — Ambulatory Visit (HOSPITAL_COMMUNITY)
Admission: RE | Admit: 2023-05-06 | Discharge: 2023-05-06 | Disposition: A | Discharge status: Home / Self Care | Payer: 59 | Attending: Gastroenterology | Admitting: Gastroenterology

## 2023-05-06 ENCOUNTER — Other Ambulatory Visit: Payer: Self-pay

## 2023-05-06 DIAGNOSIS — K469 Unspecified abdominal hernia without obstruction or gangrene: Secondary | ICD-10-CM

## 2023-05-06 DIAGNOSIS — D682 Hereditary deficiency of other clotting factors: Secondary | ICD-10-CM | POA: Insufficient documentation

## 2023-05-06 DIAGNOSIS — Z1211 Encounter for screening for malignant neoplasm of colon: Secondary | ICD-10-CM

## 2023-05-06 DIAGNOSIS — D125 Benign neoplasm of sigmoid colon: Secondary | ICD-10-CM | POA: Insufficient documentation

## 2023-05-06 DIAGNOSIS — D126 Benign neoplasm of colon, unspecified: Secondary | ICD-10-CM | POA: Diagnosis not present

## 2023-05-06 DIAGNOSIS — I10 Essential (primary) hypertension: Secondary | ICD-10-CM | POA: Insufficient documentation

## 2023-05-06 DIAGNOSIS — D122 Benign neoplasm of ascending colon: Secondary | ICD-10-CM | POA: Diagnosis not present

## 2023-05-06 DIAGNOSIS — K648 Other hemorrhoids: Secondary | ICD-10-CM | POA: Insufficient documentation

## 2023-05-06 HISTORY — PX: POLYPECTOMY: SHX5525

## 2023-05-06 HISTORY — PX: COLONOSCOPY WITH PROPOFOL: SHX5780

## 2023-05-06 LAB — HM COLONOSCOPY

## 2023-05-06 SURGERY — COLONOSCOPY WITH PROPOFOL
Anesthesia: General

## 2023-05-06 MED ORDER — PHENYLEPHRINE 80 MCG/ML (10ML) SYRINGE FOR IV PUSH (FOR BLOOD PRESSURE SUPPORT)
PREFILLED_SYRINGE | INTRAVENOUS | Status: AC
  Filled 2023-05-06: qty 10

## 2023-05-06 MED ORDER — LACTATED RINGERS IV SOLN: INTRAVENOUS | Status: DC

## 2023-05-06 MED ORDER — PROPOFOL 1000 MG/100ML IV EMUL
INTRAVENOUS | Status: AC
  Filled 2023-05-06: qty 100

## 2023-05-06 MED ORDER — LACTATED RINGERS IV SOLN
INTRAVENOUS | Status: DC | PRN
Start: 2023-05-06 — End: 2023-05-06

## 2023-05-06 MED ORDER — PHENYLEPHRINE HCL (PRESSORS) 10 MG/ML IV SOLN
INTRAVENOUS | Status: DC | PRN
  Administered 2023-05-06 (×3): 80 ug via INTRAVENOUS

## 2023-05-06 MED ORDER — PROPOFOL 10 MG/ML IV BOLUS
INTRAVENOUS | Status: DC | PRN
  Administered 2023-05-06 (×2): 60 mg via INTRAVENOUS

## 2023-05-06 MED ORDER — PROPOFOL 500 MG/50ML IV EMUL
INTRAVENOUS | Status: DC | PRN
  Administered 2023-05-06: 150 ug/kg/min via INTRAVENOUS

## 2023-05-06 NOTE — Anesthesia Preprocedure Evaluation (Signed)
Anesthesia Evaluation  Patient identified by MRN, date of birth, ID band Patient awake    Reviewed: Allergy & Precautions, H&P , NPO status , Patient's Chart, lab work & pertinent test results, reviewed documented beta blocker date and time   Airway Mallampati: II  TM Distance: >3 FB Neck ROM: full    Dental no notable dental hx.    Pulmonary neg pulmonary ROS   Pulmonary exam normal breath sounds clear to auscultation       Cardiovascular Exercise Tolerance: Good hypertension, negative cardio ROS  Rhythm:regular Rate:Normal     Neuro/Psych negative neurological ROS  negative psych ROS   GI/Hepatic negative GI ROS, Neg liver ROS,,,  Endo/Other  negative endocrine ROS    Renal/GU negative Renal ROS  negative genitourinary   Musculoskeletal   Abdominal   Peds  Hematology negative hematology ROS (+)   Anesthesia Other Findings   Reproductive/Obstetrics negative OB ROS                             Anesthesia Physical Anesthesia Plan  ASA: 2  Anesthesia Plan: General   Post-op Pain Management:    Induction:   PONV Risk Score and Plan: Propofol infusion  Airway Management Planned:   Additional Equipment:   Intra-op Plan:   Post-operative Plan:   Informed Consent: I have reviewed the patients History and Physical, chart, labs and discussed the procedure including the risks, benefits and alternatives for the proposed anesthesia with the patient or authorized representative who has indicated his/her understanding and acceptance.     Dental Advisory Given  Plan Discussed with: CRNA  Anesthesia Plan Comments:        Anesthesia Quick Evaluation  

## 2023-05-06 NOTE — Transfer of Care (Signed)
Immediate Anesthesia Transfer of Care Note  Patient: Christopher Lawson  Procedure(s) Performed: COLONOSCOPY WITH PROPOFOL POLYPECTOMY  Patient Location: Endoscopy Unit  Anesthesia Type:General  Level of Consciousness: awake, alert , and oriented  Airway & Oxygen Therapy: Patient Spontanous Breathing  Post-op Assessment: Report given to RN, Post -op Vital signs reviewed and stable, Patient moving all extremities X 4, and Patient able to stick tongue midline  Post vital signs: Reviewed and stable  Last Vitals:  Vitals Value Taken Time  BP 106/63   Temp 98.2   Pulse 89   Resp 18   SpO2 95     Last Pain:  Vitals:   05/06/23 0909  TempSrc: Oral  PainSc: 0-No pain         Complications: No notable events documented.

## 2023-05-06 NOTE — H&P (Signed)
Christopher Lawson is an 52 y.o. male.   Chief Complaint: CRC screening HPI: 52 year old male with past medical history of factor V deficiency, hypertension, coming for screening colonoscopy.  Last colonoscopy was more than 13 years ago, he reports it was normal but no reports are available.  The patient denies having any complaints such as melena, hematochezia, abdominal pain or distention, change in her bowel movement consistency or frequency, no changes in weight recently.  No family history of colorectal cancer.   Past Medical History:  Diagnosis Date   Abrasion of right index finger 11/20/2012   Arthritis    left shoulder - to have surgery 12/12/2012   Cellulitis of left lower limb    Dental crown present    Deviated nasal septum 11/2012   Essential (primary) hypertension    Factor V deficiency (HCC)    states never had a problem   Fatty (change of) liver, not elsewhere classified    Fatty liver    History of kidney stones    Hypertension    under control with meds., has been on med. x 5 yr.   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   Obesity, unspecified    Pain in left knee    Pain in right foot    Pain in right shoulder    Sebaceous cyst    Spontaneous rupture of other tendons, right upper arm    Tinnitus, right ear    Unilateral primary osteoarthritis, left knee     Past Surgical History:  Procedure Laterality Date   bicep tendon surgery Right    CYST EXCISION     wrist and back (same surgery)   EXTRACORPOREAL SHOCK WAVE LITHOTRIPSY Left 05/11/2022   Procedure: EXTRACORPOREAL SHOCK WAVE LITHOTRIPSY (ESWL);  Surgeon: Malen Gauze, MD;  Location: [facility];  Service: Urology;  Laterality: Left;   INGUINAL HERNIA REPAIR Right    left shoulder surgery     NASAL SEPTOPLASTY W/ TURBINOPLASTY Bilateral 11/27/2012   Procedure: NASAL SEPTOPLASTY WITH BILATERAL TURBINATE RESECTION;  Surgeon: Darletta Moll, MD;  Location: [facility];  Service: ENT;   Laterality: Bilateral;   UMBILICAL HERNIA REPAIR N/A 01/29/2022   Procedure: HERNIA REPAIR UMBILICAL ADULT WITH MESH;  Surgeon: Lucretia Roers, MD;  Location: [facility];  Service: General;  Laterality: N/A;   WISDOM TOOTH EXTRACTION      History reviewed. No pertinent family history. Social History:  reports that he has never smoked. He has never used smokeless tobacco. He reports current alcohol use. He reports that he does not use drugs.  Allergies: No Known Allergies  No medications prior to admission.    No results found for this or any previous visit (from the past 48 hour(s)). No results found.  Review of Systems  All other systems reviewed and are negative.   Blood pressure 106/63, pulse 79, temperature 98.2 F (36.8 C), temperature source Axillary, resp. rate 20, height 5\' 10"  (1.778 m), weight 102.1 kg, SpO2 96%. Physical Exam  GENERAL: The patient is AO x3, in no acute distress. HEENT: Head is normocephalic and atraumatic. EOMI are intact. Mouth is well hydrated and without lesions. NECK: Supple. No masses LUNGS: Clear to auscultation. No presence of rhonchi/wheezing/rales. Adequate chest expansion HEART: RRR, normal s1 and s2. ABDOMEN: Soft, nontender, no guarding, no peritoneal signs, and nondistended. BS +. No masses. EXTREMITIES: Without any cyanosis, clubbing, rash, lesions or edema. NEUROLOGIC: AOx3, no focal motor deficit. SKIN: no jaundice, no rashes  Assessment/Plan 52 year old male with past medical history of factor V deficiency, hypertension, coming for screening colonoscopy.  Will proceed with colonoscopy.  Dolores Frame, MD 05/06/2023, 10:51 AM

## 2023-05-06 NOTE — Discharge Instructions (Signed)
You are being discharged to home.  Resume your previous diet.  We are waiting for your pathology results.  Your physician has recommended a repeat colonoscopy for surveillance based on pathology results.  

## 2023-05-06 NOTE — Op Note (Signed)
[facility] Patient Name: Christopher Lawson Procedure Date: 05/06/2023 9:59 AM MRN: [id number] Date of Birth: 28-Oct-1970 Attending MD: Katrinka Blazing , , [id number] CSN: [id number] Age: 52 Admit Type: Outpatient Procedure:                Colonoscopy Indications:              Screening for colorectal malignant neoplasm Providers:                Katrinka Blazing, Nena Polio, RN, Elinor Parkinson Referring MD:              Medicines:                Monitored Anesthesia Care Complications:            No immediate complications. Estimated Blood Loss:     Estimated blood loss: none. Procedure:                Pre-Anesthesia Assessment:                           - Prior to the procedure, a History and Physical                            was performed, and patient medications, allergies                            and sensitivities were reviewed. The patient's                            tolerance of previous anesthesia was reviewed.                           - The risks and benefits of the procedure and the                            sedation options and risks were discussed with the                            patient. All questions were answered and informed                            consent was obtained.                           - ASA Grade Assessment: II - A patient with mild                            systemic disease.                           After obtaining informed consent, the colonoscope                            was passed under direct vision. Throughout the                            procedure, the patient's blood  pressure, pulse, and                            oxygen saturations were monitored continuously. The                            [id number]-HQ190L ([id number]) scope was introduced through                            the anus and advanced to the the cecum, identified                            by appendiceal orifice and ileocecal valve. The                            colonoscopy  was performed without difficulty. The                            patient tolerated the procedure well. The quality                            of the bowel preparation was excellent. Scope In: 10:08:06 AM Scope Out: 10:21:45 AM Scope Withdrawal Time: 0 hours 9 minutes 31 seconds  Total Procedure Duration: 0 hours 13 minutes 39 seconds  Findings:      The perianal and digital rectal examinations were normal.      A 4 mm polyp was found in the ascending colon. The polyp was sessile.       The polyp was removed with a cold snare. Resection and retrieval were       complete.      A 4 mm polyp was found in the sigmoid colon. The polyp was sessile. The       polyp was removed with a cold snare. Resection and retrieval were       complete.      Non-bleeding internal hemorrhoids were found during retroflexion. The       hemorrhoids were small. Impression:               - One 4 mm polyp in the ascending colon, removed                            with a cold snare. Resected and retrieved.                           - One 4 mm polyp in the sigmoid colon, removed with                            a cold snare. Resected and retrieved.                           - Non-bleeding internal hemorrhoids. Moderate Sedation:      Per Anesthesia Care Recommendation:           - Discharge patient to home (ambulatory).                           -  Resume previous diet.                           - Await pathology results.                           - Repeat colonoscopy for surveillance based on                            pathology results. Procedure Code(s):        --- Professional ---                           [phone], Colonoscopy, flexible; with removal of                            tumor(s), polyp(s), or other lesion(s) by snare                            technique Diagnosis Code(s):        --- Professional ---                           Z12.11, Encounter for screening for malignant                             neoplasm of colon                           D12.2, Benign neoplasm of ascending colon                           D12.5, Benign neoplasm of sigmoid colon                           K64.8, Other hemorrhoids CPT copyright 2022 American Medical Association. All rights reserved. The codes documented in this report are preliminary and upon coder review may  be revised to meet current compliance requirements. Katrinka Blazing, MD Katrinka Blazing,  05/06/2023 10:32:05 AM This report has been signed electronically. Number of Addenda: 0

## 2023-05-09 ENCOUNTER — Encounter (INDEPENDENT_AMBULATORY_CARE_PROVIDER_SITE_OTHER): Payer: Self-pay | Admitting: *Deleted

## 2023-05-09 LAB — SURGICAL PATHOLOGY

## 2023-05-09 NOTE — Anesthesia Postprocedure Evaluation (Signed)
Anesthesia Post Note  Patient: Christopher Lawson  Procedure(s) Performed: COLONOSCOPY WITH PROPOFOL POLYPECTOMY  Patient location during evaluation: Phase II Anesthesia Type: General Level of consciousness: awake Pain management: pain level controlled Vital Signs Assessment: post-procedure vital signs reviewed and stable Respiratory status: spontaneous breathing and respiratory function stable Cardiovascular status: blood pressure returned to baseline and stable Postop Assessment: no headache and no apparent nausea or vomiting Anesthetic complications: no Comments: Late entry   No notable events documented.   Last Vitals:  Vitals:   05/06/23 0909 05/06/23 1030  BP: (!) 151/89 106/63  Pulse: 73 79  Resp: 18 20  Temp: 36.5 C 36.8 C  SpO2: 98% 96%    Last Pain:  Vitals:   05/06/23 1030  TempSrc: Axillary  PainSc: 0-No pain                 Windell Norfolk

## 2023-05-10 ENCOUNTER — Encounter (INDEPENDENT_AMBULATORY_CARE_PROVIDER_SITE_OTHER): Payer: Self-pay | Admitting: *Deleted

## 2023-05-13 ENCOUNTER — Encounter (HOSPITAL_COMMUNITY): Payer: Self-pay | Admitting: Gastroenterology

## 2023-05-31 ENCOUNTER — Encounter: Payer: Self-pay | Admitting: Family Medicine

## 2023-06-03 ENCOUNTER — Ambulatory Visit: Payer: 59 | Admitting: Family Medicine

## 2023-06-03 VITALS — BP 130/82 | HR 79 | Temp 98.9°F | Ht 70.0 in | Wt 237.0 lb

## 2023-06-03 DIAGNOSIS — L723 Sebaceous cyst: Secondary | ICD-10-CM | POA: Diagnosis not present

## 2023-06-03 NOTE — Progress Notes (Signed)
Subjective:    Patient ID: Christopher Lawson, male    DOB: 11-14-1970, 52 y.o.   MRN: [id number] Patient has an inflamed swollen painful sebaceous cyst on his upper back roughly around the level of T2.  It is approximately 4 cm in diameter and tender to touch.  It is mildly erythematous.  It has been there for years but it recently has become aggravated. Past Medical History:  Diagnosis Date   Abrasion of right index finger 11/20/2012   Arthritis    left shoulder - to have surgery 12/12/2012   Cellulitis of left lower limb    Dental crown present    Deviated nasal septum 11/2012   Essential (primary) hypertension    Factor V deficiency (HCC)    states never had a problem   Fatty (change of) liver, not elsewhere classified    Fatty liver    History of kidney stones    Hypertension    under control with meds., has been on med. x 5 yr.   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   Obesity, unspecified    Pain in left knee    Pain in right foot    Pain in right shoulder    Sebaceous cyst    Spontaneous rupture of other tendons, right upper arm    Tinnitus, right ear    Unilateral primary osteoarthritis, left knee    Past Surgical History:  Procedure Laterality Date   bicep tendon surgery Right    COLONOSCOPY WITH PROPOFOL N/A 05/06/2023   Procedure: COLONOSCOPY WITH PROPOFOL;  Surgeon: Dolores Frame, MD;  [facility]: [facility];  Service: Gastroenterology;  Laterality: N/A;  10:00AM;ASA 1   CYST EXCISION     wrist and back (same surgery)   EXTRACORPOREAL SHOCK WAVE LITHOTRIPSY Left 05/11/2022   Procedure: EXTRACORPOREAL SHOCK WAVE LITHOTRIPSY (ESWL);  Surgeon: Malen Gauze, MD;  Location: [facility];  Service: Urology;  Laterality: Left;   INGUINAL HERNIA REPAIR Right    left shoulder surgery     NASAL SEPTOPLASTY W/ TURBINOPLASTY Bilateral 11/27/2012   Procedure: NASAL SEPTOPLASTY WITH BILATERAL TURBINATE RESECTION;  Surgeon: Darletta Moll, MD;  Location: [facility];  Service: ENT;  Laterality: Bilateral;   POLYPECTOMY  05/06/2023   Procedure: POLYPECTOMY;  Surgeon: Dolores Frame, MD;  [facility];  Service: Gastroenterology;;   UMBILICAL HERNIA REPAIR N/A 01/29/2022   Procedure: HERNIA REPAIR UMBILICAL ADULT WITH MESH;  Surgeon: Lucretia Roers, MD;  Location: [facility];  Service: General;  Laterality: N/A;   WISDOM TOOTH EXTRACTION     Current Outpatient Medications on File Prior to Visit  Medication Sig Dispense Refill   amLODipine (NORVASC) 10 MG tablet TAKE 1 TABLET BY MOUTH ONCE  DAILY 90 tablet 1   enalapril (VASOTEC) 10 MG tablet Take 10 mg by mouth daily.     Omega-3 Fatty Acids (FISH OIL PO) Take 1 capsule by mouth daily.     rosuvastatin (CRESTOR) 10 MG tablet Take 1 tablet (10 mg total) by mouth daily. 90 tablet 3   testosterone cypionate (DEPOTESTOTERONE CYPIONATE) 100 MG/ML injection Inject 2 mLs (200 mg total) into the muscle every 14 (fourteen) days. For IM use only 10 mL 1   triamcinolone cream (KENALOG) 0.1 % Apply 1 Application topically 2 (two) times daily. 30 g 2   cephALEXin (KEFLEX) 500 MG capsule Take 1 capsule (500 mg total) by mouth 3 (three) times daily. (Patient not taking: Reported  on 06/03/2023) 21 capsule 0   No current facility-administered medications on file prior to visit.   No Known Allergies  Social History   Socioeconomic History   Marital status: Married    Spouse name: Not on file   Number of children: Not on file   Years of education: Not on file   Highest education level: 12th grade  Occupational History   Not on file  Tobacco Use   Smoking status: Never   Smokeless tobacco: Never  Vaping Use   Vaping status: Never Used  Substance and Sexual Activity   Alcohol use: Yes    Comment: occasionally   Drug use: No   Sexual activity: Yes  Other Topics Concern   Not on file  Social History Narrative   Not on file   Social Determinants of Health   Financial  Resource Strain: Low Risk  (11/04/2022)   Overall Financial Resource Strain (CARDIA)    Difficulty of Paying Living Expenses: Not hard at all  Food Insecurity: No Food Insecurity (11/04/2022)   Hunger Vital Sign    Worried About Running Out of Food in the Last Year: Never true    Ran Out of Food in the Last Year: Never true  Transportation Needs: No Transportation Needs (11/04/2022)   PRAPARE - Administrator, Civil Service (Medical): No    Lack of Transportation (Non-Medical): No  Physical Activity: Sufficiently Active (11/04/2022)   Exercise Vital Sign    Days of Exercise per Week: 7 days    Minutes of Exercise per Session: 30 min  Stress: Stress Concern Present (11/04/2022)   [employer] of Occupational Health - Occupational Stress Questionnaire    Feeling of Stress : To some extent  Social Connections: Moderately Isolated (11/04/2022)   Social Connection and Isolation Panel [NHANES]    Frequency of Communication with Friends and Family: More than three times a week    Frequency of Social Gatherings with Friends and Family: Once a week    Attends Religious Services: Never    Database administrator or Organizations: No    Attends Engineer, structural: Not on file    Marital Status: Married  Catering manager Violence: Not on file      Review of Systems  Musculoskeletal:  Positive for neck pain.  All other systems reviewed and are negative.      Objective:   Physical Exam Vitals reviewed.  Constitutional:      Appearance: Normal appearance. He is normal weight.  Cardiovascular:     Rate and Rhythm: Normal rate and regular rhythm.     Heart sounds: Normal heart sounds. No murmur heard.    No friction rub. No gallop.  Pulmonary:     Effort: Pulmonary effort is normal. No respiratory distress.     Breath sounds: Normal breath sounds. No wheezing, rhonchi or rales.  Musculoskeletal:     Cervical back: Normal range of motion. No pain with movement.      Thoracic back: No tenderness.       Back:  Lymphadenopathy:     Cervical: No cervical adenopathy.  Neurological:     Mental Status: He is alert.           Assessment & Plan:  Inflamed sebaceous cyst Patient has an inflamed sebaceous cyst.  I anesthetized the skin with 0.1% lidocaine.  I then prepped the skin with Betadine.  I made a 1 cm vertical incision in the center of the cyst.  I was able to express roughly 20 cc of foul-smelling cyst sac material.  The cyst sac was no longer intact and could not be removed in 1.  I then cleaned the cavity with a Q-tip and peroxide and packed the cavity with 1/4 inch iodoform gauze approximately 4 inches.  Wound care was discussed.  Recheck next week if not improving

## 2023-06-17 ENCOUNTER — Ambulatory Visit: Payer: 59

## 2023-06-21 ENCOUNTER — Other Ambulatory Visit: Payer: 59

## 2023-06-21 DIAGNOSIS — E7800 Pure hypercholesterolemia, unspecified: Secondary | ICD-10-CM

## 2023-06-21 DIAGNOSIS — R7989 Other specified abnormal findings of blood chemistry: Secondary | ICD-10-CM

## 2023-06-21 DIAGNOSIS — E291 Testicular hypofunction: Secondary | ICD-10-CM

## 2023-06-22 LAB — LIPID PANEL
Cholesterol: 120 mg/dL (ref <200)
HDL: 45 mg/dL (ref >=40)
LDL Cholesterol (Calc): 61 mg/dL
Non-HDL Cholesterol (Calc): 75 mg/dL (ref <130)
Total CHOL/HDL Ratio: 2.7 (calc) (ref <5.0)
Triglycerides: 63 mg/dL (ref <150)

## 2023-06-22 LAB — TESTOSTERONE: Testosterone: 1183 ng/dL — ABNORMAL HIGH (ref 250–827)

## 2023-06-23 ENCOUNTER — Other Ambulatory Visit: Payer: Self-pay | Admitting: Family Medicine

## 2023-06-23 MED ORDER — TESTOSTERONE CYPIONATE 100 MG/ML IM SOLN: 200.0000 mg | INTRAMUSCULAR | 1 refills | Status: DC

## 2023-07-02 ENCOUNTER — Other Ambulatory Visit: Payer: Self-pay | Admitting: Family Medicine

## 2023-07-04 NOTE — Telephone Encounter (Signed)
Requested medication (s) are due for refill today: -  Requested medication (s) are on the active medication list: amlodipine yes  Last refill:  enalapril: 08/25/22 (historical med)     amlodipine: 02/01/23 #90 1 RF  Future visit scheduled: no  Notes to clinic:  enalapril: historical med and provider   Requested Prescriptions  Pending Prescriptions Disp Refills   enalapril (VASOTEC) 10 MG tablet [Pharmacy Med Name: ENALAPRIL  10MG   TAB] 180 tablet 3    Sig: TAKE 2 TABLETS BY MOUTH DAILY     Cardiovascular:  ACE Inhibitors Failed - 07/02/2023  8:33 AM      Failed - Valid encounter within last 6 months    Recent Outpatient Visits           1 year ago Fatigue, unspecified type   [facility] Donita Brooks, MD   1 year ago Pain of left midfoot   [facility] Donita Brooks, MD   2 years 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 Donita Brooks, MD   2 years ago Neck pain   [facility] Donita Brooks, MD   3 years ago Primary hypertension   [facility] Pickard, Priscille Heidelberg, MD              Passed - Cr in normal range and within 180 days    Creat  Date Value Ref Range Status  02/14/2023 0.99 0.70 - 1.30 mg/dL Final         Passed - K in normal range and within 180 days    Potassium  Date Value Ref Range Status  02/14/2023 4.1 3.5 - 5.3 mmol/L Final         Passed - Patient is not pregnant      Passed - Last BP in normal range    BP Readings from Last 1 Encounters:  06/03/23 130/82          amLODipine (NORVASC) 10 MG tablet [Pharmacy Med Name: amLODIPine Besylate 10 MG Oral Tablet] 90 tablet 3    Sig: TAKE 1 TABLET BY MOUTH ONCE  DAILY     Cardiovascular: Calcium Channel Blockers 2 Failed - 07/02/2023  8:33 AM      Failed - Valid encounter within last 6 months    Recent Outpatient Visits           1 year ago Fatigue, unspecified type   [facility]  Donita Brooks, MD   1 year ago Pain of left midfoot   [facility] Pickard, Priscille Heidelberg, MD   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

## 2023-07-13 ENCOUNTER — Telehealth: Payer: Self-pay | Admitting: Family Medicine

## 2023-07-13 ENCOUNTER — Other Ambulatory Visit: Payer: Self-pay

## 2023-07-13 ENCOUNTER — Encounter: Payer: Self-pay | Admitting: Family Medicine

## 2023-07-13 DIAGNOSIS — E7800 Pure hypercholesterolemia, unspecified: Secondary | ICD-10-CM

## 2023-07-13 MED ORDER — ROSUVASTATIN CALCIUM 10 MG PO TABS: 10.0000 mg | ORAL_TABLET | Freq: Every day | ORAL | 3 refills | Status: DC

## 2023-07-13 NOTE — Telephone Encounter (Signed)
Prescription Request  07/13/2023  LOV: 06/03/2023  What is the name of the medication or equipment? rosuvastatin (CRESTOR) 10 MG tablet   Have you contacted your pharmacy to request a refill? Yes   Which pharmacy would you like this sent to?  [facility] [place], [place] - [address] [place] [address] [place] [address] Phone: [phone] Fax: [phone]    Patient notified that their request is being sent to the clinical staff for review and that they should receive a response within 2 business days.   Please advise at [facility] [phone]

## 2023-07-18 ENCOUNTER — Other Ambulatory Visit: Payer: Self-pay

## 2023-07-18 DIAGNOSIS — I10 Essential (primary) hypertension: Secondary | ICD-10-CM

## 2023-07-18 MED ORDER — AMLODIPINE BESYLATE 10 MG PO TABS
ORAL_TABLET | ORAL | 2 refills | Status: DC
Start: 2023-07-18 — End: 2024-02-22

## 2023-07-18 MED ORDER — ENALAPRIL MALEATE 10 MG PO TABS
10.0000 mg | ORAL_TABLET | Freq: Every day | ORAL | 2 refills | Status: DC
Start: 2023-07-18 — End: 2024-03-08

## 2023-09-15 ENCOUNTER — Encounter: Payer: Self-pay | Admitting: Family Medicine

## 2023-09-16 ENCOUNTER — Encounter: Payer: Self-pay | Admitting: Family Medicine

## 2023-09-16 ENCOUNTER — Ambulatory Visit: Payer: 59 | Admitting: Family Medicine

## 2023-09-16 VITALS — BP 138/90 | HR 82 | Temp 98.2°F | Ht 70.0 in | Wt 242.0 lb

## 2023-09-16 DIAGNOSIS — L723 Sebaceous cyst: Secondary | ICD-10-CM | POA: Insufficient documentation

## 2023-09-16 MED ORDER — DOXYCYCLINE HYCLATE 100 MG PO TABS
100.0000 mg | ORAL_TABLET | Freq: Two times a day (BID) | ORAL | 1 refills | Status: AC
Start: 2023-09-16 — End: 2023-09-26

## 2023-09-16 NOTE — Progress Notes (Signed)
 Patient Office Visit  Assessment & Plan:  Inflamed sebaceous cyst -     Doxycycline  Hyclate; Take 1 tablet (100 mg total) by mouth 2 (two) times daily for 10 days.  Dispense: 20 tablet; Refill: 1    No follow-ups on file.   Subjective:    Patient ID: Christopher Lawson, male    DOB: [birth year]  Age: 53 y.o. MRN: [id number]  Chief Complaint  Patient presents with   Cyst    Pt states Dr Duanne drained a cyst and now he thinks it is back due to a foul smell.     HPI Sebaceous cyst-  Inflamed cyst over upper back-pt having foul smelling drainage the past 2 days.  Pt believes the cyst has opened.  Patient not able to see it but thinks it has opened up because of the smell.  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  Had sebaceous cyst lanced last October per Dr. Duanne.  Patient does not think he got antibiotics for this.  Patient has had many cysts I&D in the past.  Patient also has had several skin lesions removed per dermatology..  Patient is not diabetic and is not allergic to anything.  No fever or chills.  The ASCVD Risk score (Arnett DK, et al., 2019) failed to calculate for the following reasons:   The valid total cholesterol range is 130 to 320 mg/dL  Past Medical History:  Diagnosis Date   Abrasion of right index finger 11/20/2012   Arthritis    left shoulder - to have surgery 12/12/2012   Cellulitis of left lower limb    Dental crown present    Deviated nasal septum 11/2012   Essential (primary) hypertension    Factor V deficiency (HCC)    states never had a problem   Fatty (change of) liver, not elsewhere classified    Fatty liver    History of kidney stones    Hypertension    under control with meds., has been on med. x 5 yr.   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   Obesity, unspecified    Pain in left knee    Pain in right foot    Pain in right shoulder    Sebaceous cyst    Spontaneous rupture of other tendons, right upper arm     Tinnitus, right ear    Unilateral primary osteoarthritis, left knee    Past Surgical History:  Procedure Laterality Date   bicep tendon surgery Right    COLONOSCOPY WITH PROPOFOL  N/A 05/06/2023   Procedure: COLONOSCOPY WITH PROPOFOL ;  Surgeon: Eartha Angelia Sieving, MD;  [facility];  Service: Gastroenterology;  Laterality: N/A;  10:00AM;ASA 1   CYST EXCISION     wrist and back (same surgery)   EXTRACORPOREAL SHOCK WAVE LITHOTRIPSY Left 05/11/2022   Procedure: EXTRACORPOREAL SHOCK WAVE LITHOTRIPSY (ESWL);  Surgeon: Sherrilee Belvie CROME, MD;  Location: [facility];  Service: Urology;  Laterality: Left;   INGUINAL HERNIA REPAIR Right    left shoulder surgery     NASAL SEPTOPLASTY W/ TURBINOPLASTY Bilateral 11/27/2012   Procedure: NASAL SEPTOPLASTY WITH BILATERAL TURBINATE RESECTION;  Surgeon: Ana LELON Moccasin, MD;  Location: [facility];  Service: ENT;  Laterality: Bilateral;   POLYPECTOMY  05/06/2023   Procedure: POLYPECTOMY;  Surgeon: Eartha Angelia Sieving, MD;  [facility];  Service: Gastroenterology;;   UMBILICAL HERNIA REPAIR N/A 01/29/2022   Procedure: HERNIA REPAIR UMBILICAL ADULT WITH MESH;  Surgeon:  Kallie Manuelita BROCKS, MD;  Location: [facility];  Service: General;  Laterality: N/A;   WISDOM TOOTH EXTRACTION     Social History   Tobacco Use   Smoking status: Never   Smokeless tobacco: Never  Vaping Use   Vaping status: Never Used  Substance Use Topics   Alcohol use: Yes    Comment: occasionally   Drug use: No   History reviewed. No pertinent family history. No Known Allergies  ROS    Objective:    BP (!) 138/90   Pulse 82   Temp 98.2 F (36.8 C)   Ht 5' 10 (1.778 m)   Wt 242 lb (109.8 kg)   SpO2 98%   BMI 34.72 kg/m  BP Readings from Last 3 Encounters:  09/16/23 (!) 138/90  06/03/23 130/82  05/06/23 106/63   Wt Readings from Last 3 Encounters:  09/16/23 242 lb (109.8 kg)  06/03/23 237 lb (107.5 kg)  05/06/23 225 lb (102.1 kg)     Physical Exam Constitutional:      General: He is not in acute distress.    Appearance: Normal appearance.  Skin:    Findings: Erythema and lesion present.     Comments: Upper back has 2.5cm by 2.5 cm palpable cyst with opening, has purulent drainage. Slightly tender to palpation. Pt has several scars over back area from previous skin lesions removals   Neurological:     Mental Status: He is alert.      No results found for any visits on 09/16/23.

## 2023-11-29 ENCOUNTER — Encounter: Payer: Self-pay | Admitting: Family Medicine

## 2023-12-01 ENCOUNTER — Ambulatory Visit: Admitting: Family Medicine

## 2023-12-01 ENCOUNTER — Encounter: Payer: Self-pay | Admitting: Family Medicine

## 2023-12-01 VITALS — BP 126/76 | HR 75 | Temp 98.3°F | Ht 70.0 in | Wt 237.8 lb

## 2023-12-01 DIAGNOSIS — L301 Dyshidrosis [pompholyx]: Secondary | ICD-10-CM

## 2023-12-01 MED ORDER — CLOTRIMAZOLE 1 % EX CREA: 1.0000 | TOPICAL_CREAM | Freq: Two times a day (BID) | CUTANEOUS | 0 refills | Status: DC

## 2023-12-01 MED ORDER — MOMETASONE FUROATE 0.1 % EX CREA: TOPICAL_CREAM | Freq: Every day | CUTANEOUS | 1 refills | Status: DC

## 2023-12-01 NOTE — Progress Notes (Signed)
 Subjective:    Patient ID: Christopher Lawson, male    DOB: 03-15-1971, 53 y.o.   MRN: [id number]  Rash  Patient has red daily skin in the webspaces between the 1st, 2nd and 3rd toes on both feet.  There is 1 small blister on the webspace below the left second toe at the MTP joint.  There is no serpiginous border.  It itches.  He has a history of dyshidrotic eczema. Past Medical History:  Diagnosis Date   Abrasion of right index finger 11/20/2012   Arthritis    left shoulder - to have surgery 12/12/2012   Cellulitis of left lower limb    Dental crown present    Deviated nasal septum 11/2012   Essential (primary) hypertension    Factor V deficiency (HCC)    states never had a problem   Fatty (change of) liver, not elsewhere classified    Fatty liver    History of kidney stones    Hypertension    under control with meds., has been on med. x 5 yr.   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   Obesity, unspecified    Pain in left knee    Pain in right foot    Pain in right shoulder    Sebaceous cyst    Spontaneous rupture of other tendons, right upper arm    Tinnitus, right ear    Unilateral primary osteoarthritis, left knee    Past Surgical History:  Procedure Laterality Date   bicep tendon surgery Right    COLONOSCOPY WITH PROPOFOL  N/A 05/06/2023   Procedure: COLONOSCOPY WITH PROPOFOL ;  Surgeon: Urban Garden, MD;  Location: [facility];  Service: Gastroenterology;  Laterality: N/A;  10:00AM;ASA 1   CYST EXCISION     wrist and back (same surgery)   EXTRACORPOREAL SHOCK WAVE LITHOTRIPSY Left 05/11/2022   Procedure: EXTRACORPOREAL SHOCK WAVE LITHOTRIPSY (ESWL);  Surgeon: Marco Severs, MD;  Location: [facility];  Service: Urology;  Laterality: Left;   INGUINAL HERNIA REPAIR Right    left shoulder surgery     NASAL SEPTOPLASTY W/ TURBINOPLASTY Bilateral 11/27/2012   Procedure: NASAL SEPTOPLASTY WITH BILATERAL TURBINATE RESECTION;  Surgeon: Lawence Press, MD;   Location: [facility];  Service: ENT;  Laterality: Bilateral;   POLYPECTOMY  05/06/2023   Procedure: POLYPECTOMY;  Surgeon: Urban Garden, MD;  Location: [facility];  Service: Gastroenterology;;   UMBILICAL HERNIA REPAIR N/A 01/29/2022   Procedure: HERNIA REPAIR UMBILICAL ADULT WITH MESH;  Surgeon: Awilda Bogus, MD;  Location: [facility];  Service: General;  Laterality: N/A;   WISDOM TOOTH EXTRACTION     Current Outpatient Medications on File Prior to Visit  Medication Sig Dispense Refill   amLODipine  (NORVASC ) 10 MG tablet TAKE 1 TABLET BY MOUTH ONCE  DAILY 90 tablet 2   cephALEXin  (KEFLEX ) 500 MG capsule Take 1 capsule (500 mg total) by mouth 3 (three) times daily. 21 capsule 0   enalapril  (VASOTEC ) 10 MG tablet Take 1 tablet (10 mg total) by mouth daily. 90 tablet 2   Omega-3 Fatty Acids (FISH OIL PO) Take 1 capsule by mouth daily.     rosuvastatin  (CRESTOR ) 10 MG tablet Take 1 tablet (10 mg total) by mouth daily. 90 tablet 3   testosterone  cypionate (DEPOTESTOTERONE CYPIONATE) 100 MG/ML injection Inject 2 mLs (200 mg total) into the muscle every 14 (fourteen) days. For IM use only 10 mL 1   triamcinolone  cream (KENALOG ) 0.1 %  Apply 1 Application topically 2 (two) times daily. 30 g 2   No current facility-administered medications on file prior to visit.   No Known Allergies  Social History   Socioeconomic History   Marital status: Married    Spouse name: Not on file   Number of children: Not on file   Years of education: Not on file   Highest education level: 12th grade  Occupational History   Not on file  Tobacco Use   Smoking status: Never   Smokeless tobacco: Never  Vaping Use   Vaping status: Never Used  Substance and Sexual Activity   Alcohol use: Yes    Comment: occasionally   Drug use: No   Sexual activity: Yes  Other Topics Concern   Not on file  Social History Narrative   Not on file   Social Drivers of Health   Financial  Resource Strain: Low Risk  (11/04/2022)   Overall Financial Resource Strain (CARDIA)    Difficulty of Paying Living Expenses: Not hard at all  Food Insecurity: No Food Insecurity (11/04/2022)   Hunger Vital Sign    Worried About Running Out of Food in the Last Year: Never true    Ran Out of Food in the Last Year: Never true  Transportation Needs: No Transportation Needs (11/04/2022)   PRAPARE - Administrator, Civil Service (Medical): No    Lack of Transportation (Non-Medical): No  Physical Activity: Sufficiently Active (11/04/2022)   Exercise Vital Sign    Days of Exercise per Week: 7 days    Minutes of Exercise per Session: 30 min  Stress: Stress Concern Present (11/04/2022)   [employer] of Occupational Health - Occupational Stress Questionnaire    Feeling of Stress : To some extent  Social Connections: Moderately Isolated (11/04/2022)   Social Connection and Isolation Panel [NHANES]    Frequency of Communication with Friends and Family: More than three times a week    Frequency of Social Gatherings with Friends and Family: Once a week    Attends Religious Services: Never    Database administrator or Organizations: No    Attends Engineer, structural: Not on file    Marital Status: Married  Catering manager Violence: Not on file      Review of Systems  Skin:  Positive for rash.  All other systems reviewed and are negative.      Objective:   Physical Exam Vitals reviewed.  Constitutional:      Appearance: Normal appearance. He is normal weight.  Cardiovascular:     Rate and Rhythm: Normal rate and regular rhythm.     Heart sounds: Normal heart sounds. No murmur heard.    No friction rub. No gallop.  Pulmonary:     Effort: Pulmonary effort is normal. No respiratory distress.     Breath sounds: Normal breath sounds. No wheezing, rhonchi or rales.  Musculoskeletal:     Cervical back: Normal range of motion.       Feet:  Feet:     Right foot:      Skin integrity: Skin breakdown and erythema present. No ulcer or blister.     Left foot:     Skin integrity: Skin breakdown and erythema present. No ulcer or blister.  Lymphadenopathy:     Cervical: No cervical adenopathy.  Skin:    Findings: Erythema and rash present. Rash is macular.  Neurological:     Mental Status: He is alert.  Assessment & Plan:  Dyshidrotic eczema I believe this is dyshidrotic eczema due to sweating.  I recommended that the patient change his socks more frequently to keep the skin dry.  I recommended that he use mometasone  cream daily at night to try to calm down the irritation.  If worsening, tinea pedis is also on the differential diagnosis.

## 2023-12-27 ENCOUNTER — Encounter: Payer: Self-pay | Admitting: Family Medicine

## 2024-01-11 ENCOUNTER — Other Ambulatory Visit

## 2024-01-11 DIAGNOSIS — E7800 Pure hypercholesterolemia, unspecified: Secondary | ICD-10-CM

## 2024-01-11 DIAGNOSIS — R5382 Chronic fatigue, unspecified: Secondary | ICD-10-CM

## 2024-01-11 DIAGNOSIS — I10 Essential (primary) hypertension: Secondary | ICD-10-CM

## 2024-01-11 DIAGNOSIS — R7989 Other specified abnormal findings of blood chemistry: Secondary | ICD-10-CM

## 2024-01-11 DIAGNOSIS — E291 Testicular hypofunction: Secondary | ICD-10-CM

## 2024-01-11 DIAGNOSIS — K760 Fatty (change of) liver, not elsewhere classified: Secondary | ICD-10-CM

## 2024-01-12 ENCOUNTER — Ambulatory Visit: Payer: Self-pay | Admitting: Family Medicine

## 2024-01-12 LAB — TESTOSTERONE: Testosterone: 814 ng/dL (ref 250–827)

## 2024-01-12 LAB — COMPLETE METABOLIC PANEL WITHOUT GFR
AG Ratio: 1.9 (calc) (ref 1.0–2.5)
ALT: 72 U/L — ABNORMAL HIGH (ref 9–46)
AST: 32 U/L (ref 10–35)
Albumin: 4.3 g/dL (ref 3.6–5.1)
Alkaline phosphatase (APISO): 89 U/L (ref 35–144)
BUN: 11 mg/dL (ref 7–25)
CO2: 27 mmol/L (ref 20–32)
Calcium: 9 mg/dL (ref 8.6–10.3)
Chloride: 105 mmol/L (ref 98–110)
Creat: 0.93 mg/dL (ref 0.70–1.30)
Globulin: 2.3 g/dL (ref 1.9–3.7)
Glucose, Bld: 130 mg/dL — ABNORMAL HIGH (ref 65–99)
Potassium: 4.4 mmol/L (ref 3.5–5.3)
Sodium: 140 mmol/L (ref 135–146)
Total Bilirubin: 0.7 mg/dL (ref 0.2–1.2)
Total Protein: 6.6 g/dL (ref 6.1–8.1)

## 2024-01-12 LAB — CBC WITH DIFFERENTIAL/PLATELET
Absolute Lymphocytes: 1877 {cells}/uL (ref 850–3900)
Absolute Monocytes: 551 {cells}/uL (ref 200–950)
Basophils Absolute: 41 {cells}/uL (ref 0–200)
Basophils Relative: 0.6 %
Eosinophils Absolute: 320 {cells}/uL (ref 15–500)
Eosinophils Relative: 4.7 %
HCT: 47.5 % (ref 38.5–50.0)
Hemoglobin: 15.9 g/dL (ref 13.2–17.1)
MCH: 31.3 pg (ref 27.0–33.0)
MCHC: 33.5 g/dL (ref 32.0–36.0)
MCV: 93.5 fL (ref 80.0–100.0)
MPV: 10.1 fL (ref 7.5–12.5)
Monocytes Relative: 8.1 %
Neutro Abs: 4012 {cells}/uL (ref 1500–7800)
Neutrophils Relative %: 59 %
Platelets: 203 10*3/uL (ref 140–400)
RBC: 5.08 10*6/uL (ref 4.20–5.80)
RDW: 12.5 % (ref 11.0–15.0)
Total Lymphocyte: 27.6 %
WBC: 6.8 10*3/uL (ref 3.8–10.8)

## 2024-01-12 LAB — LIPID PANEL
Cholesterol: 112 mg/dL (ref <200)
HDL: 39 mg/dL — ABNORMAL LOW (ref >=40)
LDL Cholesterol (Calc): 60 mg/dL
Non-HDL Cholesterol (Calc): 73 mg/dL (ref <130)
Total CHOL/HDL Ratio: 2.9 (calc) (ref <5.0)
Triglycerides: 57 mg/dL (ref <150)

## 2024-01-12 LAB — PSA: PSA: 1.16 ng/mL (ref <=4.00)

## 2024-01-17 ENCOUNTER — Encounter: Payer: Self-pay | Admitting: Family Medicine

## 2024-01-17 ENCOUNTER — Ambulatory Visit: Admitting: Family Medicine

## 2024-01-17 VITALS — BP 129/87 | HR 81 | Ht 70.0 in | Wt 232.0 lb

## 2024-01-17 DIAGNOSIS — Z0000 Encounter for general adult medical examination without abnormal findings: Secondary | ICD-10-CM

## 2024-01-17 DIAGNOSIS — E291 Testicular hypofunction: Secondary | ICD-10-CM | POA: Diagnosis not present

## 2024-01-17 DIAGNOSIS — Z0001 Encounter for general adult medical examination with abnormal findings: Secondary | ICD-10-CM

## 2024-01-17 DIAGNOSIS — R739 Hyperglycemia, unspecified: Secondary | ICD-10-CM

## 2024-01-17 DIAGNOSIS — I10 Essential (primary) hypertension: Secondary | ICD-10-CM

## 2024-01-17 NOTE — Progress Notes (Signed)
 Subjective:    Patient ID: Christopher Lawson, male    DOB: October 31, 1970, 53 y.o.   MRN: [id number]  Patient is a very pleasant 53 year old Caucasian gentleman here today for complete physical exam.  Patient had a colonoscopy last year.  Due to the presence of a tubular adenoma they recommended a repeat colonoscopy in 7 years.  Most recent lab work is listed below. Lab on 01/11/2024  Component Date Value Ref Range Status   WBC 01/11/2024 6.8  3.8 - 10.8 Thousand/uL Final   RBC 01/11/2024 5.08  4.20 - 5.80 Million/uL Final   Hemoglobin 01/11/2024 15.9  13.2 - 17.1 g/dL Final   HCT 25/36/6440 47.5  38.5 - 50.0 % Final   MCV 01/11/2024 93.5  80.0 - 100.0 fL Final   MCH 01/11/2024 31.3  27.0 - 33.0 pg Final   MCHC 01/11/2024 33.5  32.0 - 36.0 g/dL Final   Comment: For adults, a slight decrease in the calculated MCHC value (in the range of 30 to 32 g/dL) is most likely not clinically significant; however, it should be interpreted with caution in correlation with other red cell parameters and the patient's clinical condition.    RDW 01/11/2024 12.5  11.0 - 15.0 % Final   Platelets 01/11/2024 203  140 - 400 Thousand/uL Final   MPV 01/11/2024 10.1  7.5 - 12.5 fL Final   Neutro Abs 01/11/2024 4,012  1,500 - 7,800 cells/uL Final   Absolute Lymphocytes 01/11/2024 1,877  850 - 3,900 cells/uL Final   Absolute Monocytes 01/11/2024 551  200 - 950 cells/uL Final   Eosinophils Absolute 01/11/2024 320  15 - 500 cells/uL Final   Basophils Absolute 01/11/2024 41  0 - 200 cells/uL Final   Neutrophils Relative % 01/11/2024 59  % Final   Total Lymphocyte 01/11/2024 27.6  % Final   Monocytes Relative 01/11/2024 8.1  % Final   Eosinophils Relative 01/11/2024 4.7  % Final   Basophils Relative 01/11/2024 0.6  % Final   Glucose, Bld 01/11/2024 130 (H)  65 - 99 mg/dL Final   Comment: .            Fasting reference interval . For someone without known diabetes, a glucose value >125 mg/dL indicates that they  may have diabetes and this should be confirmed with a follow-up test. .    BUN 01/11/2024 11  7 - 25 mg/dL Final   Creat 34/74/2595 0.93  0.70 - 1.30 mg/dL Final   BUN/Creatinine Ratio 01/11/2024 SEE NOTE:  6 - 22 (calc) Final   Comment:    Not Reported: BUN and Creatinine are within    reference range. .    Sodium 01/11/2024 140  135 - 146 mmol/L Final   Potassium 01/11/2024 4.4  3.5 - 5.3 mmol/L Final   Chloride 01/11/2024 105  98 - 110 mmol/L Final   CO2 01/11/2024 27  20 - 32 mmol/L Final   Calcium  01/11/2024 9.0  8.6 - 10.3 mg/dL Final   Total Protein 63/87/5643 6.6  6.1 - 8.1 g/dL Final   Albumin 32/95/1884 4.3  3.6 - 5.1 g/dL Final   Globulin 16/60/6301 2.3  1.9 - 3.7 g/dL (calc) Final   AG Ratio 01/11/2024 1.9  1.0 - 2.5 (calc) Final   Total Bilirubin 01/11/2024 0.7  0.2 - 1.2 mg/dL Final   Alkaline phosphatase (APISO) 01/11/2024 89  35 - 144 U/L Final   AST 01/11/2024 32  10 - 35 U/L Final   ALT 01/11/2024 72 (  H)  9 - 46 U/L Final   Cholesterol 01/11/2024 112  <200 mg/dL Final   HDL 08/65/7846 39 (L)  > OR = 40 mg/dL Final   Triglycerides 96/29/5284 57  <150 mg/dL Final   LDL Cholesterol (Calc) 01/11/2024 60  mg/dL (calc) Final   Comment: Reference range: <100 . Desirable range <100 mg/dL for primary prevention;   <70 mg/dL for patients with CHD or diabetic patients  with > or = 2 CHD risk factors. Aaron Aas LDL-C is now calculated using the Martin-Hopkins  calculation, which is a validated novel method providing  better accuracy than the Friedewald equation in the  estimation of LDL-C.  Melinda Sprawls et al. Erroll Heard. 1324;401(02): 2061-2068  (http://education.[facility].com/faq/FAQ164)    Total CHOL/HDL Ratio 01/11/2024 2.9  <7.2 (calc) Final   Non-HDL Cholesterol (Calc) 01/11/2024 73  <130 mg/dL (calc) Final   Comment: For patients with diabetes plus 1 major ASCVD risk  factor, treating to a non-HDL-C goal of <100 mg/dL  (LDL-C of <53 mg/dL) is considered a therapeutic   option.    Testosterone  01/11/2024 814  250 - 827 ng/dL Final   PSA 66/44/0347 1.16  < OR = 4.00 ng/mL Final   Comment: The total PSA value from this assay system is  standardized against the WHO standard. The test  result will be approximately 20% lower when compared  to the equimolar-standardized total PSA (Beckman  Coulter). Comparison of serial PSA results should be  interpreted with this fact in mind. . This test was performed using the Siemens  chemiluminescent method. Values obtained from  different assay methods cannot be used interchangeably. PSA levels, regardless of value, should not be interpreted as absolute evidence of the presence or absence of disease.    Of note, his fasting blood sugar was 130.  He has a history of liver disease and his ALT was elevated.  HDL cholesterol was slightly low.  Past Medical History:  Diagnosis Date   Abrasion of right index finger 11/20/2012   Arthritis    left shoulder - to have surgery 12/12/2012   Cellulitis of left lower limb    Dental crown present    Deviated nasal septum 11/2012   Essential (primary) hypertension    Factor V deficiency (HCC)    states never had a problem   Fatty (change of) liver, not elsewhere classified    Fatty liver    History of kidney stones    Hypertension    under control with meds., has been on med. x 5 yr.   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   Obesity, unspecified    Pain in left knee    Pain in right foot    Pain in right shoulder    Sebaceous cyst    Spontaneous rupture of other tendons, right upper arm    Tinnitus, right ear    Unilateral primary osteoarthritis, left knee    Past Surgical History:  Procedure Laterality Date   bicep tendon surgery Right    COLONOSCOPY WITH PROPOFOL  N/A 05/06/2023   Procedure: COLONOSCOPY WITH PROPOFOL ;  Surgeon: Urban Garden, MD;  Location: [facility];  Service: Gastroenterology;  Laterality: N/A;  10:00AM;ASA 1   CYST  EXCISION     wrist and back (same surgery)   EXTRACORPOREAL SHOCK WAVE LITHOTRIPSY Left 05/11/2022   Procedure: EXTRACORPOREAL SHOCK WAVE LITHOTRIPSY (ESWL);  Surgeon: Marco Severs, MD;  Location: [facility];  Service: Urology;  Laterality: Left;   INGUINAL HERNIA REPAIR  Right    left shoulder surgery     NASAL SEPTOPLASTY W/ TURBINOPLASTY Bilateral 11/27/2012   Procedure: NASAL SEPTOPLASTY WITH BILATERAL TURBINATE RESECTION;  Surgeon: Lawence Press, MD;  Location: [facility];  Service: ENT;  Laterality: Bilateral;   POLYPECTOMY  05/06/2023   Procedure: POLYPECTOMY;  Surgeon: Urban Garden, MD;  Location: [facility];  Service: Gastroenterology;;   UMBILICAL HERNIA REPAIR N/A 01/29/2022   Procedure: HERNIA REPAIR UMBILICAL ADULT WITH MESH;  Surgeon: Awilda Bogus, MD;  Location: [facility];  Service: General;  Laterality: N/A;   WISDOM TOOTH EXTRACTION     Current Outpatient Medications on File Prior to Visit  Medication Sig Dispense Refill   amLODipine  (NORVASC ) 10 MG tablet TAKE 1 TABLET BY MOUTH ONCE  DAILY 90 tablet 2   enalapril  (VASOTEC ) 10 MG tablet Take 1 tablet (10 mg total) by mouth daily. 90 tablet 2   Omega-3 Fatty Acids (FISH OIL PO) Take 1 capsule by mouth daily.     rosuvastatin  (CRESTOR ) 10 MG tablet Take 1 tablet (10 mg total) by mouth daily. 90 tablet 3   testosterone  cypionate (DEPOTESTOTERONE CYPIONATE) 100 MG/ML injection Inject 2 mLs (200 mg total) into the muscle every 14 (fourteen) days. For IM use only 10 mL 1   No current facility-administered medications on file prior to visit.   Allergies  Allergen Reactions   Chicken Allergy Swelling    Tongue swells when he eats chicken     Social History   Socioeconomic History   Marital status: Married    Spouse name: Not on file   Number of children: Not on file   Years of education: Not on file   Highest education level: 12th grade  Occupational History   Not on file  Tobacco Use    Smoking status: Never   Smokeless tobacco: Never  Vaping Use   Vaping status: Never Used  Substance and Sexual Activity   Alcohol use: Yes    Comment: occasionally   Drug use: No   Sexual activity: Yes  Other Topics Concern   Not on file  Social History Narrative   Not on file   Social Drivers of Health   Financial Resource Strain: Low Risk  (11/04/2022)   Overall Financial Resource Strain (CARDIA)    Difficulty of Paying Living Expenses: Not hard at all  Food Insecurity: No Food Insecurity (11/04/2022)   Hunger Vital Sign    Worried About Running Out of Food in the Last Year: Never true    Ran Out of Food in the Last Year: Never true  Transportation Needs: No Transportation Needs (11/04/2022)   PRAPARE - Administrator, Civil Service (Medical): No    Lack of Transportation (Non-Medical): No  Physical Activity: Sufficiently Active (11/04/2022)   Exercise Vital Sign    Days of Exercise per Week: 7 days    Minutes of Exercise per Session: 30 min  Stress: Stress Concern Present (11/04/2022)   [employer] of Occupational Health - Occupational Stress Questionnaire    Feeling of Stress : To some extent  Social Connections: Moderately Isolated (11/04/2022)   Social Connection and Isolation Panel [NHANES]    Frequency of Communication with Friends and Family: More than three times a week    Frequency of Social Gatherings with Friends and Family: Once a week    Attends Religious Services: Never    Database administrator or Organizations: No    Attends Club or  Organization Meetings: Not on file    Marital Status: Married  Intimate Partner Violence: Not on file      Review of Systems  Musculoskeletal:  Positive for neck pain.  All other systems reviewed and are negative.      Objective:   Physical Exam Vitals reviewed.  Constitutional:      General: He is not in acute distress.    Appearance: Normal appearance. He is normal weight. He is not ill-appearing,  toxic-appearing or diaphoretic.  HENT:     Head: Normocephalic and atraumatic.     Right Ear: Tympanic membrane and ear canal normal.     Left Ear: Tympanic membrane and ear canal normal.     Nose: Nose normal. No congestion or rhinorrhea.     Mouth/Throat:     Mouth: Mucous membranes are moist.     Pharynx: Oropharynx is clear. No oropharyngeal exudate or posterior oropharyngeal erythema.  Eyes:     Extraocular Movements: Extraocular movements intact.     Conjunctiva/sclera: Conjunctivae normal.     Pupils: Pupils are equal, round, and reactive to light.  Neck:     Vascular: No carotid bruit.  Cardiovascular:     Rate and Rhythm: Normal rate and regular rhythm.     Heart sounds: Normal heart sounds. No murmur heard.    No friction rub. No gallop.  Pulmonary:     Effort: Pulmonary effort is normal. No respiratory distress.     Breath sounds: Normal breath sounds. No wheezing, rhonchi or rales.  Abdominal:     General: Abdomen is flat. Bowel sounds are normal. There is no distension.     Palpations: Abdomen is soft.     Tenderness: There is no abdominal tenderness. There is no right CVA tenderness, left CVA tenderness, guarding or rebound.  Musculoskeletal:     Cervical back: Normal range of motion and neck supple.     Right lower leg: No edema.     Left lower leg: No edema.  Lymphadenopathy:     Cervical: No cervical adenopathy.  Skin:    Coloration: Skin is not jaundiced.     Findings: Lesion present. No erythema or rash.  Neurological:     General: No focal deficit present.     Mental Status: He is alert and oriented to person, place, and time. Mental status is at baseline.     Cranial Nerves: No cranial nerve deficit.     Sensory: No sensory deficit.     Motor: No weakness.     Coordination: Coordination normal.     Gait: Gait normal.     Deep Tendon Reflexes: Reflexes normal.  Psychiatric:        Mood and Affect: Mood normal.        Behavior: Behavior normal.         Thought Content: Thought content normal.        Judgment: Judgment normal.    Patient has numerous scars on his back from skin surgery but has noticed that he also has a sebaceous cyst roughly at the level of T7 in the center of his back.       Assessment & Plan:  Elevated blood sugar - Plan: Hemoglobin A1c  General medical exam  Hypogonadism in male  Primary hypertension I recommended the shingles vaccine.  I recommended Pneumovax 23.  Because of his elevated blood sugar I recommended a hemoglobin A1c.  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  I recommended a strict low-carb low-fat diet with aerobic exercise and weight loss.  I would like to recheck his liver function test and his hemoglobin A1c in 3 to 4 months.  Meanwhile reduce testosterone  to 150 mg every 2 weeks.  PSA was excellent.

## 2024-01-18 LAB — HEMOGLOBIN A1C
Hgb A1c MFr Bld: 6.3 % — ABNORMAL HIGH (ref <5.7)
Mean Plasma Glucose: 134 mg/dL
eAG (mmol/L): 7.4 mmol/L

## 2024-01-19 ENCOUNTER — Ambulatory Visit: Payer: Self-pay | Admitting: Family Medicine

## 2024-01-22 ENCOUNTER — Encounter: Payer: Self-pay | Admitting: Family Medicine

## 2024-01-23 ENCOUNTER — Ambulatory Visit
Admission: RE | Admit: 2024-01-23 | Discharge: 2024-01-23 | Disposition: A | Discharge status: Home / Self Care | Source: Ambulatory Visit | Attending: Nurse Practitioner | Admitting: Nurse Practitioner

## 2024-01-23 ENCOUNTER — Other Ambulatory Visit: Payer: Self-pay | Admitting: Family Medicine

## 2024-01-23 VITALS — BP 137/88 | HR 100 | Temp 98.1°F | Resp 18

## 2024-01-23 DIAGNOSIS — R21 Rash and other nonspecific skin eruption: Secondary | ICD-10-CM

## 2024-01-23 DIAGNOSIS — L309 Dermatitis, unspecified: Secondary | ICD-10-CM | POA: Diagnosis not present

## 2024-01-23 MED ORDER — CEPHALEXIN 500 MG PO CAPS: 500.0000 mg | ORAL_CAPSULE | Freq: Three times a day (TID) | ORAL | 0 refills | Status: AC

## 2024-01-23 MED ORDER — CEPHALEXIN 500 MG PO CAPS: 500.0000 mg | ORAL_CAPSULE | Freq: Four times a day (QID) | ORAL | 0 refills | Status: DC

## 2024-01-23 NOTE — ED Triage Notes (Signed)
 Pt reports he is having a cellulitis flare up in his left leg x 2 days

## 2024-01-23 NOTE — Discharge Instructions (Addendum)
 Continue using the triamcinolone  cream on the rash twice daily.  Apply the cream to clean skin.  Additionally, take the Keflex  four times daily for 5 days to treat for superficial infection in your skin.  If symptoms worsen despite treatment, recommend ER evaluation.

## 2024-01-23 NOTE — ED Provider Notes (Signed)
 [facility]    CSN: [id number] Arrival date & time: 01/23/24  4098      History   Chief Complaint Chief Complaint  Patient presents with   Rash    HPI Christopher Lawson is a 53 y.o. male.   Patient presents today with 2-day history of left lower extremity redness, warmth, and soreness.  Denies recent trauma or known injury to the area.  Reports history of dermatitis and cellulitis in the same area.  Has been using triamcinolone  cream that was prescribed by primary care provider for similar with no significant change in symptoms.  He endorses fever 101 F over the weekend, no nausea or vomiting.  Has been otherwise feeling and acting normally.  Patient reports he has bicep surgery scheduled for later this week and is wondering if he needs to make the surgeon aware.    Past Medical History:  Diagnosis Date   Abrasion of right index finger 11/20/2012   Arthritis    left shoulder - to have surgery 12/12/2012   Cellulitis of left lower limb    Dental crown present    Deviated nasal septum 11/2012   Essential (primary) hypertension    Factor V deficiency (HCC)    states never had a problem   Fatty (change of) liver, not elsewhere classified    Fatty liver    History of kidney stones    Hypertension    under control with meds., has been on med. x 5 yr.   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   De Quervain's tenosynovitis, right 06/29/2022   Incarcerated umbilical hernia 12/17/2021    Hypertension 06/27/2019   History of colonic polyps 06/05/2019   Fatty liver    Biceps tendon tear 08/19/2014   Elbow pain 04/30/2014    Past Surgical History:  Procedure Laterality Date   bicep tendon surgery Right    COLONOSCOPY WITH PROPOFOL  N/A 05/06/2023   Procedure: COLONOSCOPY WITH PROPOFOL ;  Surgeon: Urban Garden, MD;  Location: [facility];  Service: Gastroenterology;  Laterality: N/A;  10:00AM;ASA 1   CYST EXCISION     wrist and back (same surgery)   EXTRACORPOREAL SHOCK WAVE LITHOTRIPSY Left 05/11/2022   Procedure: EXTRACORPOREAL SHOCK WAVE LITHOTRIPSY (ESWL);  Surgeon: Marco Severs, MD;  Location: [facility];  Service: Urology;  Laterality: Left;   INGUINAL HERNIA REPAIR Right    left shoulder surgery     NASAL SEPTOPLASTY W/ TURBINOPLASTY Bilateral 11/27/2012   Procedure: NASAL SEPTOPLASTY WITH BILATERAL TURBINATE RESECTION;  Surgeon: Lawence Press, MD;  Location: [facility];  Service: ENT;  Laterality: Bilateral;   POLYPECTOMY  05/06/2023   Procedure: POLYPECTOMY;  Surgeon: Urban Garden, MD;  Location: [facility];  Service: Gastroenterology;;   UMBILICAL HERNIA REPAIR N/A 01/29/2022   Procedure: HERNIA REPAIR UMBILICAL ADULT WITH MESH;  Surgeon: Awilda Bogus, MD;  Location: [facility];  Service: General;  Laterality: N/A;   WISDOM TOOTH EXTRACTION         Home Medications    Prior to Admission medications   Medication Sig Start Date End Date Taking? Authorizing Provider  cephALEXin  (KEFLEX ) 500 MG capsule Take 1 capsule (500 mg total) by mouth every 6 (six) hours for 5 days. 01/23/24 01/28/24 Yes Wilhemena Harbour, NP  amLODipine  (NORVASC ) 10 MG tablet TAKE 1 TABLET BY MOUTH ONCE  DAILY 07/18/23   Austine Lefort, MD  enalapril  (VASOTEC ) 10 MG tablet Take 1 tablet (10 mg total) by mouth daily. 07/18/23   Austine Lefort, MD  Omega-3 Fatty Acids (FISH OIL PO) Take 1 capsule by mouth daily.    [provider]   rosuvastatin  (CRESTOR ) 10 MG tablet Take 1 tablet (10 mg total) by mouth daily. 07/13/23   Austine Lefort, MD  testosterone  cypionate (DEPOTESTOTERONE CYPIONATE) 100 MG/ML injection Inject 2 mLs (200 mg total) into the muscle every 14 (fourteen) days. For IM use only 06/23/23   Austine Lefort, MD    Family History History reviewed. No pertinent family history.  Social History Social History   Tobacco Use   Smoking status: Never   Smokeless tobacco: Never  Vaping Use   Vaping status: Never Used  Substance Use Topics   Alcohol use: Yes    Comment: occasionally   Drug use: No     Allergies   Chicken allergy   Review of Systems Review of Systems Per HPI  Physical Exam Triage Vital Signs ED Triage Vitals  Encounter Vitals Group     BP 01/23/24 0952 137/88     Girls Systolic BP Percentile --      Girls Diastolic BP Percentile --      Boys Systolic BP Percentile --      Boys Diastolic BP Percentile --      Pulse Rate 01/23/24 0952 100     Resp 01/23/24 0952 18     Temp 01/23/24 0952 98.1 F (36.7 C)     Temp Source 01/23/24 0952 Oral     SpO2 01/23/24 0952 93 %     Weight --      Height --      Head Circumference --      Peak Flow --      Pain Score 01/23/24 0953 4     Pain Loc --      Pain Education --      Exclude from Growth Chart --    No data found.  Updated Vital Signs BP 137/88 (BP Location: Right Arm)   Pulse 100   Temp 98.1 F (36.7 C) (Oral)   Resp 18   SpO2 93%   Visual Acuity Right Eye Distance:   Left Eye Distance:   Bilateral Distance:    Right Eye Near:   Left Eye Near:    Bilateral Near:     Physical Exam Vitals and nursing note reviewed.  Constitutional:      General: He is not in acute distress.    Appearance: Normal appearance. He is not toxic-appearing.  HENT:     Head: Normocephalic and atraumatic.     Mouth/Throat:     Mouth: Mucous membranes are moist.  Pulmonary:     Effort: Pulmonary effort is normal. No  respiratory distress.   Skin:    General: Skin is warm and dry.     Capillary Refill: Capillary refill takes less than 2 seconds.     Coloration:  Skin is not jaundiced or pale.     Findings: Erythema and rash present. Rash is macular, papular and pustular.     Comments: See photographs below; large erythematous area to LLE warm to touch; there are areas of petechiae as well as patches/maculespustular and papular lesions.     Neurological:     Mental Status: He is alert and oriented to person, place, and time.   Psychiatric:        Behavior: Behavior is cooperative.           UC Treatments / Results  Labs (all labs ordered are listed, but only abnormal results are displayed) Labs Reviewed - No data to display  EKG   Radiology No results found.  Procedures Procedures (including critical care time)  Medications Ordered in UC Medications - No data to display  Initial Impression / Assessment and Plan / UC Course  I have reviewed the triage vital signs and the nursing notes.  Pertinent labs & imaging results that were available during my care of the patient were reviewed by me and considered in my medical decision making (see chart for details).   Patient is well-appearing, normotensive, afebrile, not tachycardic, not tachypneic, oxygenating well on room air.   1. Rash and nonspecific skin eruption 2. Dermatitis Unclear cause of rash Encouraged good skin care and continue topical steroid cream Additionally given fever, will treat for cellulitis with Keflex  four times daily for 5 days; recently kidney function normal I recommended he notify the surgery office who he is scheduled with for later this week Strict ER precautions discussed with patient Work note provided   The patient was given the opportunity to ask questions.  All questions answered to their satisfaction.  The patient is in agreement to this plan.   Final Clinical Impressions(s) / UC Diagnoses   Final  diagnoses:  Rash and nonspecific skin eruption  Dermatitis     Discharge Instructions      Continue using the triamcinolone  cream on the rash twice daily.  Apply the cream to clean skin.  Additionally, take the Keflex  four times daily for 5 days to treat for superficial infection in your skin.  If symptoms worsen despite treatment, recommend ER evaluation.     ED Prescriptions     Medication Sig Dispense Auth. Provider   cephALEXin  (KEFLEX ) 500 MG capsule Take 1 capsule (500 mg total) by mouth every 6 (six) hours for 5 days. 20 capsule Wilhemena Harbour, NP      PDMP not reviewed this encounter.   Wilhemena Harbour, NP 01/23/24 1046

## 2024-01-26 ENCOUNTER — Encounter: Payer: Self-pay | Admitting: Family Medicine

## 2024-01-30 ENCOUNTER — Other Ambulatory Visit: Payer: Self-pay | Admitting: Family Medicine

## 2024-01-30 MED ORDER — TESTOSTERONE CYPIONATE 100 MG/ML IM SOLN: 75.0000 mg | INTRAMUSCULAR | 0 refills | Status: DC

## 2024-02-13 ENCOUNTER — Other Ambulatory Visit: Payer: Self-pay | Admitting: Family Medicine

## 2024-02-13 ENCOUNTER — Telehealth: Payer: Self-pay

## 2024-02-13 MED ORDER — TESTOSTERONE CYPIONATE 100 MG/ML IM SOLN: 75.0000 mg | INTRAMUSCULAR | 0 refills | Status: DC

## 2024-02-13 NOTE — Telephone Encounter (Signed)
 Grayce, pharmacist with [facility] called to verify dosing of Testosterone . Pt has been receiving Testosterone  Cypionate 100 mg/ml injections 2 mLs every 14 days and last refill was 0.75 mLs every 14 days. Grayce just wanted to clarify dose. Thanks.

## 2024-02-14 ENCOUNTER — Other Ambulatory Visit (HOSPITAL_COMMUNITY): Payer: Self-pay

## 2024-02-14 ENCOUNTER — Other Ambulatory Visit: Payer: Self-pay | Admitting: Family Medicine

## 2024-02-14 MED ORDER — TESTOSTERONE CYPIONATE 100 MG/ML IM SOLN: 100.0000 mg | INTRAMUSCULAR | 0 refills | Status: AC

## 2024-02-21 ENCOUNTER — Other Ambulatory Visit: Payer: Self-pay | Admitting: Family Medicine

## 2024-02-21 DIAGNOSIS — I10 Essential (primary) hypertension: Secondary | ICD-10-CM

## 2024-03-07 ENCOUNTER — Other Ambulatory Visit: Payer: Self-pay | Admitting: Family Medicine

## 2024-03-07 DIAGNOSIS — I10 Essential (primary) hypertension: Secondary | ICD-10-CM

## 2024-03-30 ENCOUNTER — Encounter: Payer: Self-pay | Admitting: Radiology

## 2024-05-23 ENCOUNTER — Encounter (INDEPENDENT_AMBULATORY_CARE_PROVIDER_SITE_OTHER): Payer: Self-pay | Admitting: Gastroenterology

## 2024-05-28 ENCOUNTER — Other Ambulatory Visit: Payer: Self-pay | Admitting: Family Medicine

## 2024-05-28 DIAGNOSIS — E7800 Pure hypercholesterolemia, unspecified: Secondary | ICD-10-CM

## 2024-06-11 ENCOUNTER — Encounter: Payer: Self-pay | Admitting: Radiology
